# Patient Record
Sex: Female | Born: 1985 | Race: Black or African American | Hispanic: No | Marital: Single | Smoking: Never smoker
Health system: Southern US, Community
[De-identification: ages and names within clinical notes are randomized; demographics above are authoritative.]

## PROBLEM LIST (undated history)

## (undated) ENCOUNTER — Inpatient Hospital Stay (HOSPITAL_COMMUNITY): Payer: Self-pay

## (undated) DIAGNOSIS — B019 Varicella without complication: Secondary | ICD-10-CM

## (undated) DIAGNOSIS — Z9109 Other allergy status, other than to drugs and biological substances: Secondary | ICD-10-CM

## (undated) DIAGNOSIS — E119 Type 2 diabetes mellitus without complications: Secondary | ICD-10-CM

## (undated) DIAGNOSIS — J45909 Unspecified asthma, uncomplicated: Secondary | ICD-10-CM

## (undated) DIAGNOSIS — G43909 Migraine, unspecified, not intractable, without status migrainosus: Secondary | ICD-10-CM

## (undated) DIAGNOSIS — R12 Heartburn: Secondary | ICD-10-CM

## (undated) HISTORY — DX: Varicella without complication: B01.9

## (undated) HISTORY — DX: Migraine, unspecified, not intractable, without status migrainosus: G43.909

## (undated) HISTORY — DX: Other allergy status, other than to drugs and biological substances: Z91.09

## (undated) HISTORY — DX: Heartburn: R12

## (undated) HISTORY — DX: Unspecified asthma, uncomplicated: J45.909

## (undated) HISTORY — PX: OTHER SURGICAL HISTORY: SHX169

---

## 2005-05-29 ENCOUNTER — Emergency Department (HOSPITAL_COMMUNITY): Admission: EM | Admit: 2005-05-29 | Discharge: 2005-05-29 | Payer: Self-pay | Admitting: Emergency Medicine

## 2005-08-20 ENCOUNTER — Emergency Department (HOSPITAL_COMMUNITY): Admission: EM | Admit: 2005-08-20 | Discharge: 2005-08-21 | Payer: Self-pay | Admitting: Emergency Medicine

## 2005-09-08 ENCOUNTER — Emergency Department (HOSPITAL_COMMUNITY): Admission: EM | Admit: 2005-09-08 | Discharge: 2005-09-08 | Payer: Self-pay | Admitting: Emergency Medicine

## 2005-10-27 IMAGING — CT CT ABDOMEN W/ CM
1 of 3 series · 14 of 32 positions shown, 19 images · IV contrast (APPLIED)
Comparison: None.

CLINICAL DATA: Sudden onset right sided abdominal pain.  
 ABDOMEN CT WITH CONTRAST:
TECHNIQUE: Multidetector CT imaging of the abdomen was performed following the standard protocol during bolus administration of intravenous contrast.
 Contrast:  100 cc Omnipaque 300
TECHNIQUE: Multidetector CT imaging of the pelvis was performed following the standard protocol during bolus administration of intravenous contrast.

[Series 4: abd/pelv with 5.0 b31f st · axial · 0.55mm/px · z∈[-390,-30]mm · 14 of 81 slices shown, 19 images]
[im 5/81  soft-tissue]
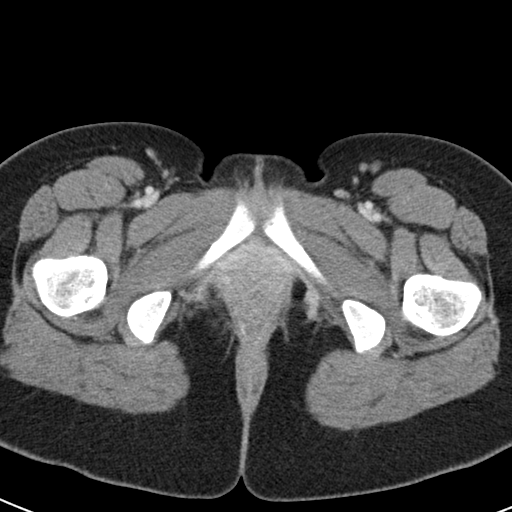
[im 5/81  bone]
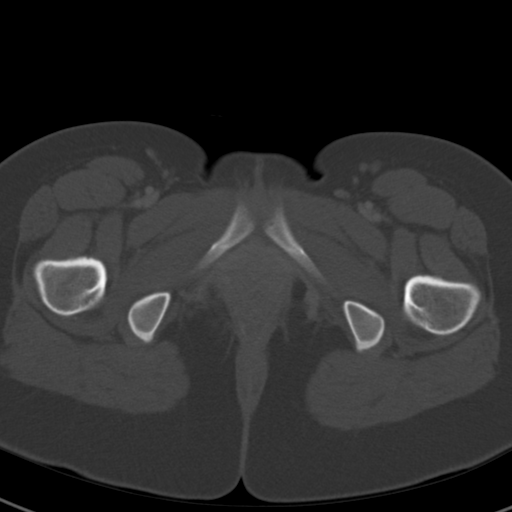
[im 13/81  soft-tissue]
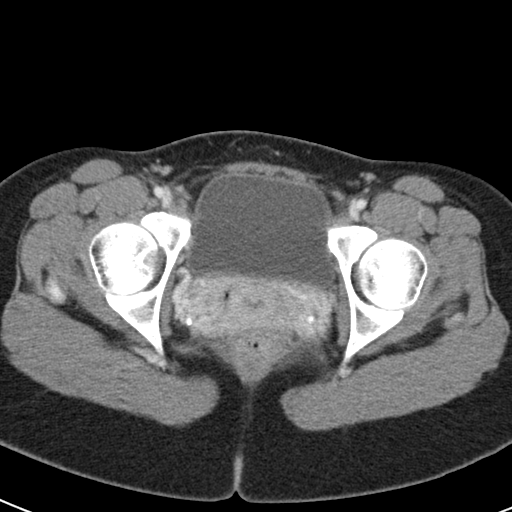
[im 17/81  soft-tissue]
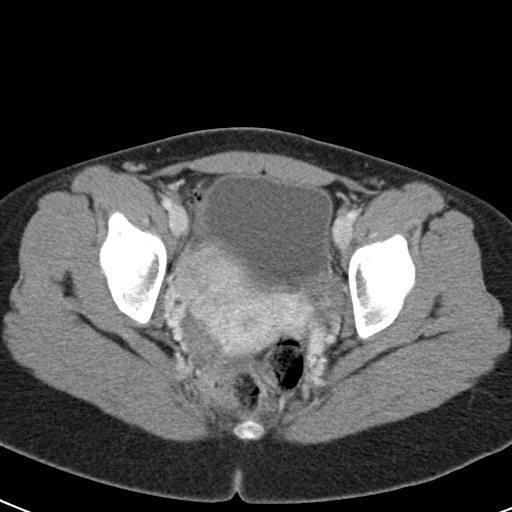
[im 25/81  soft-tissue]
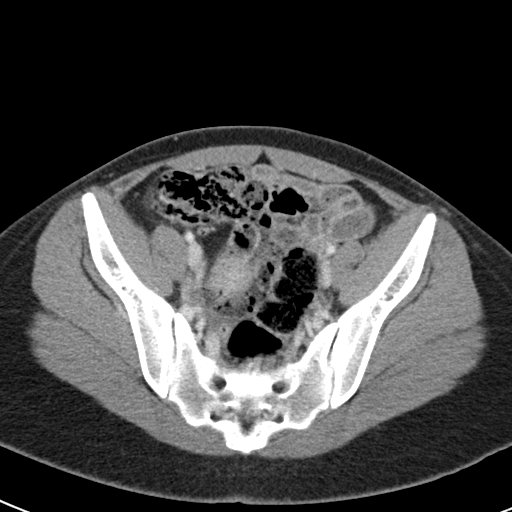
[im 29/81  soft-tissue]
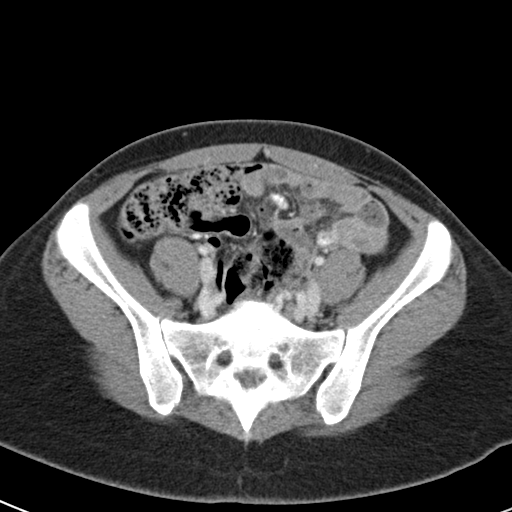
[im 37/81  soft-tissue]
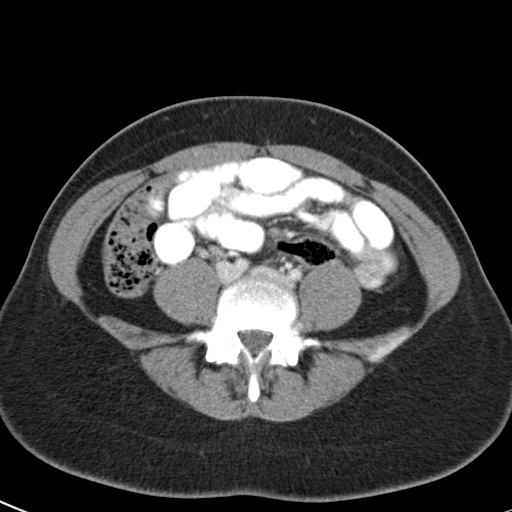
[im 41/81  soft-tissue]
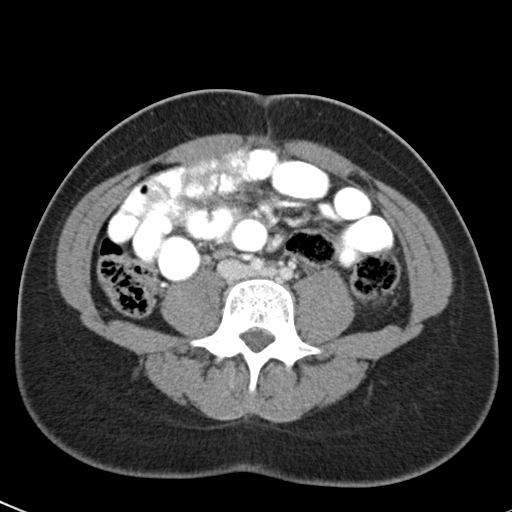
[im 45/81  soft-tissue]
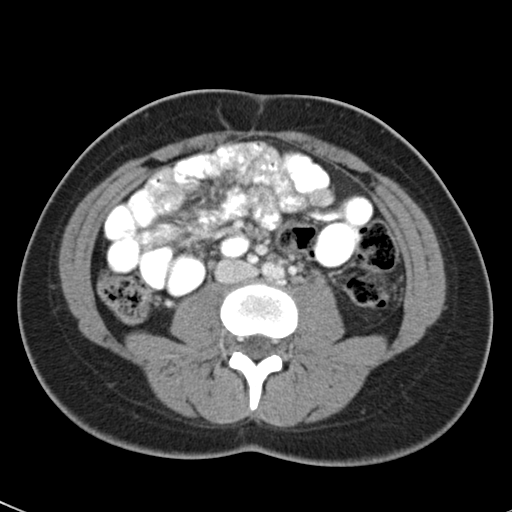
[im 53/81  soft-tissue]
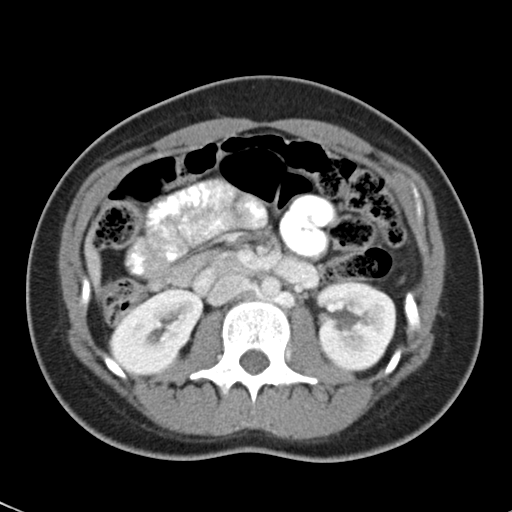
[im 53/81  bone]
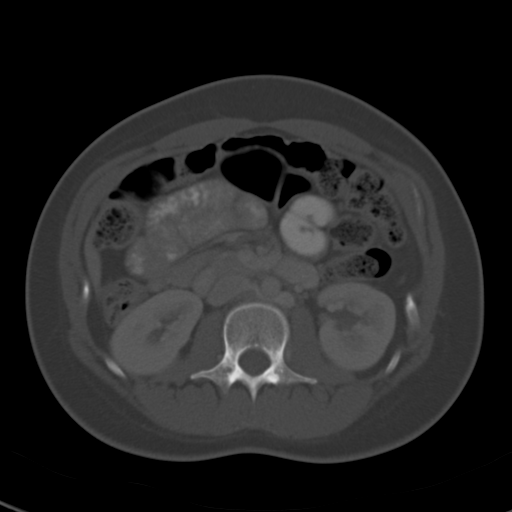
[im 57/81  soft-tissue]
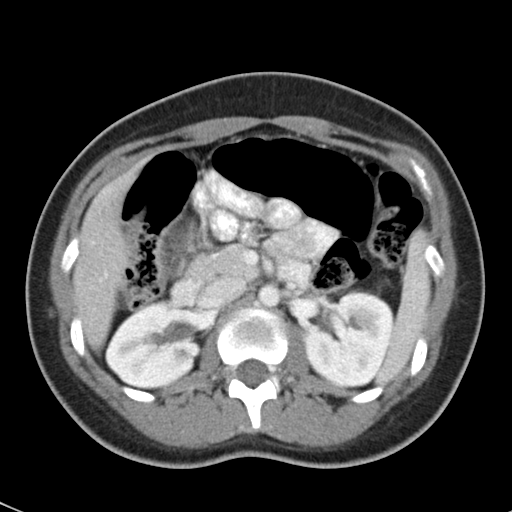
[im 65/81  soft-tissue]
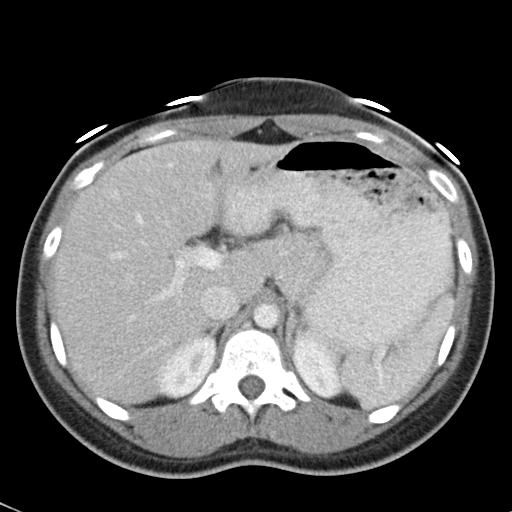
[im 65/81  lung]
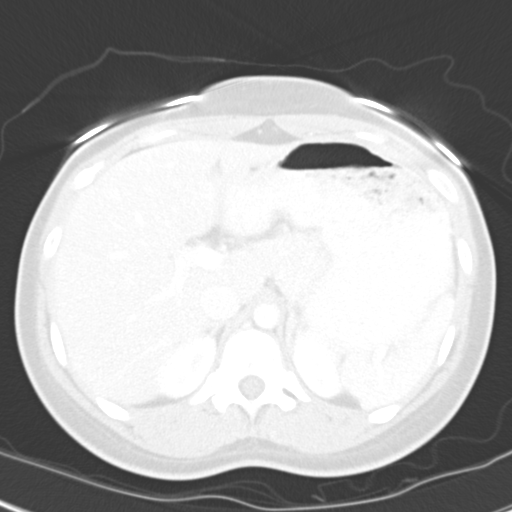
[im 69/81  soft-tissue]
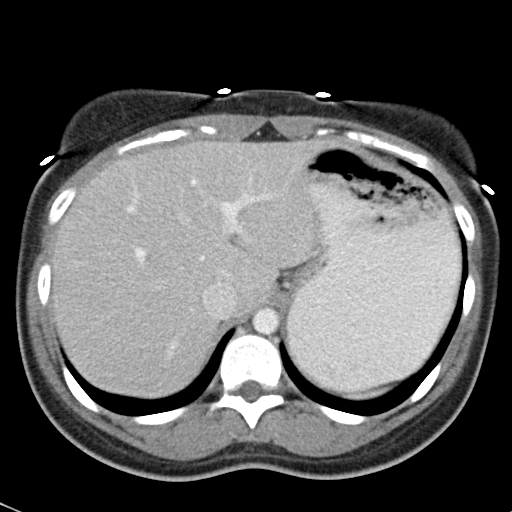
[im 69/81  lung]
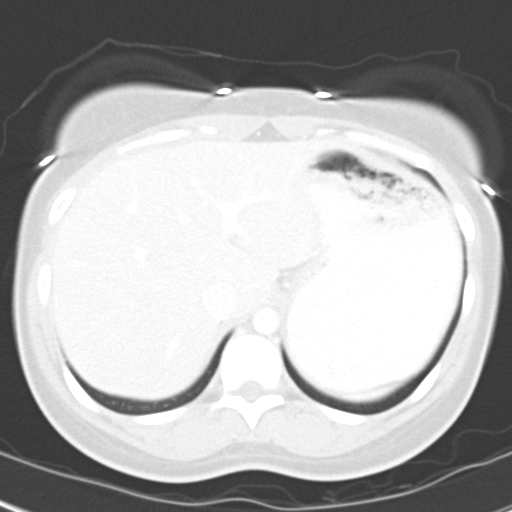
[im 73/81  lung]
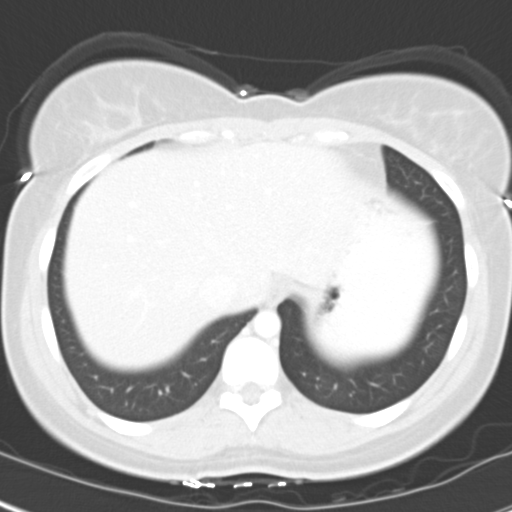
[im 77/81  soft-tissue]
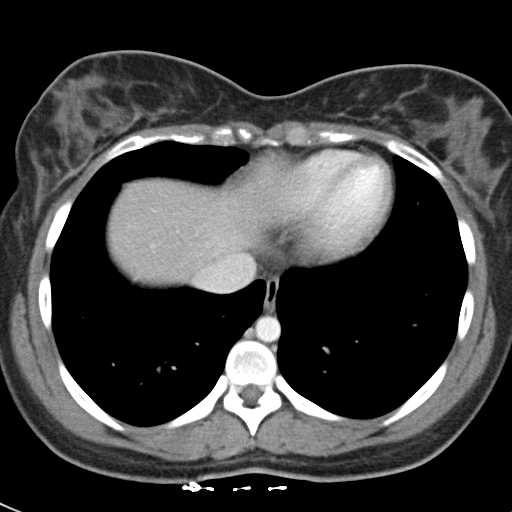
[im 77/81  lung]
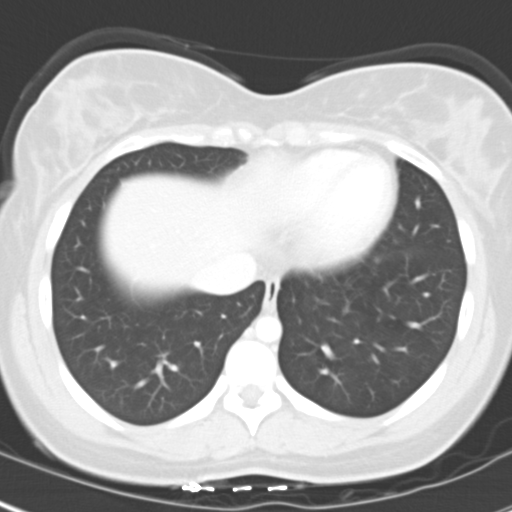

[14 of 32 positions shown; findings below may reference images not displayed]

FINDINGS: Lung bases are clear.  No pleural or pericardial effusion.  A small low attenuation lesion is seen in the anterior segment of the right lobe of the liver compatible with a small cyst.  The liver is otherwise unremarkable.  No intrahepatic duct dilatation.  Gallbladder appears normal.  Adrenal glands, pancreas, spleen and left kidney are all unremarkable.  A tiny low attenuation lesion is seen in the interpolar region of the right kidney compatible with a small cyst.  The stomach and small bowel are unremarkable.  No abdominal fluid collections or lymphadenopathy.
IMPRESSION: No acute finding in the abdomen with small hepatic and right renal cyst noted.  
 PELVIS CT WITH CONTRAST:
FINDINGS: The uterus and adnexa are unremarkable.  The appendix is seen in the right lower quadrant (image 53 series 5) and appears normal.  No pelvic fluid collections or lymphadenopathy.  The colon is unremarkable.  Uterus and adnexa appear normal.
IMPRESSION: Negative pelvic CT scan.

## 2006-06-01 ENCOUNTER — Emergency Department (HOSPITAL_COMMUNITY): Admission: EM | Admit: 2006-06-01 | Discharge: 2006-06-01 | Payer: Self-pay | Admitting: Family Medicine

## 2012-06-07 ENCOUNTER — Encounter: Payer: Self-pay | Admitting: Family

## 2012-06-07 ENCOUNTER — Ambulatory Visit (INDEPENDENT_AMBULATORY_CARE_PROVIDER_SITE_OTHER): Payer: Managed Care, Other (non HMO) | Admitting: Family

## 2012-06-07 VITALS — BP 112/76 | HR 79 | Temp 99.4°F | Resp 14 | Ht <= 58 in | Wt 144.0 lb

## 2012-06-07 DIAGNOSIS — Z13220 Encounter for screening for lipoid disorders: Secondary | ICD-10-CM

## 2012-06-07 DIAGNOSIS — J309 Allergic rhinitis, unspecified: Secondary | ICD-10-CM

## 2012-06-07 DIAGNOSIS — R5383 Other fatigue: Secondary | ICD-10-CM

## 2012-06-07 DIAGNOSIS — R5381 Other malaise: Secondary | ICD-10-CM

## 2012-06-07 DIAGNOSIS — R350 Frequency of micturition: Secondary | ICD-10-CM

## 2012-06-07 DIAGNOSIS — R635 Abnormal weight gain: Secondary | ICD-10-CM

## 2012-06-07 LAB — BASIC METABOLIC PANEL
BUN: 8 mg/dL (ref 6–23)
CO2: 25 mEq/L (ref 19–32)
Calcium: 9.2 mg/dL (ref 8.4–10.5)
Chloride: 103 mEq/L (ref 96–112)
Glucose, Bld: 69 mg/dL — ABNORMAL LOW (ref 70–99)
Potassium: 4 mEq/L (ref 3.5–5.1)

## 2012-06-07 LAB — POCT URINALYSIS DIPSTICK
Bilirubin, UA: NEGATIVE
Blood, UA: NEGATIVE
Ketones, UA: NEGATIVE
Leukocytes, UA: NEGATIVE
Nitrite, UA: NEGATIVE
Protein, UA: NEGATIVE
Spec Grav, UA: >=1.03
pH, UA: 5.5

## 2012-06-07 LAB — LIPID PANEL
Cholesterol: 136 mg/dL (ref 0–200)
HDL: 58.3 mg/dL (ref >39.00)
LDL Cholesterol: 62 mg/dL (ref 0–99)
Total CHOL/HDL Ratio: 2
VLDL: 16.2 mg/dL (ref 0.0–40.0)

## 2012-06-07 NOTE — Progress Notes (Signed)
  Subjective:    Patient ID: Victoria Yoder, female    DOB: September 18, 1986, 26 y.o.   MRN: [id number]  HPI  26 year old AAF is in today is in to be established. She has concerns of diabetes and urinary frequency. Also reports weight gain of about 20 pounds over the last 6 months. She exercises about twice a week. Her last CPX was 2011. She has not had a pap since 2011.  Review of Systems  Constitutional: Positive for appetite change, fatigue and unexpected weight change.  HENT: Negative.   Eyes: Negative.   Cardiovascular: Negative.   Gastrointestinal: Negative.   Genitourinary: Positive for frequency.  Musculoskeletal: Negative.   Skin: Negative.   Neurological: Negative.   Hematological: Negative.   Psychiatric/Behavioral: Negative.      Past Medical History  Diagnosis Date  . Environmental allergies   . Heartburn   . Asthma   . Chicken pox   . Migraine     History   Social History  . Marital Status: Single    Spouse Name: N/A    Number of Children: N/A  . Years of Education: N/A   Occupational History  . Not on file.   Social History Main Topics  . Smoking status: Never Smoker   . Smokeless tobacco: Not on file  . Alcohol Use: Not on file  . Drug Use: Not on file  . Sexually Active: Not on file   Other Topics Concern  . Not on file   Social History Narrative  . No narrative on file    Past Surgical History  Procedure Date  . Unremarkable.     Family History  Problem Relation Age of Onset  . Diabetes Maternal Grandmother   . Diabetes Paternal Grandmother   . Heart disease Paternal Grandmother   . Hypertension Paternal Grandmother   . Stroke Maternal Grandmother     No Known Allergies  Current Outpatient Prescriptions on File Prior to Visit  Medication Sig Dispense Refill  . loratadine (CLARITIN) 10 MG tablet Take 10 mg by mouth as needed.        BP 112/76  Pulse 79  Temp 99.4 F (37.4 C) (Oral)  Resp 14  Ht 4\' 3"  (1.295 m)  Wt 144 lb  (65.318 kg)  BMI 38.92 kg/m2  SpO2 93%  LMP 07/01/2013chart     Objective:   Physical Exam  Constitutional: She is oriented to person, place, and time. She appears well-developed and well-nourished.  HENT:  Right Ear: External ear normal.  Left Ear: External ear normal.  Nose: Nose normal.  Mouth/Throat: Oropharynx is clear and moist.  Neck: Normal range of motion. Neck supple.  Cardiovascular: Normal rate, regular rhythm and normal heart sounds.   Pulmonary/Chest: Effort normal and breath sounds normal.  Abdominal: Soft. Bowel sounds are normal.  Musculoskeletal: Normal range of motion.  Neurological: She is alert and oriented to person, place, and time.  Skin: Skin is warm and dry.  Psychiatric: She has a normal mood and affect.          Assessment & Plan:  Assessment: Urinary Frequency, Weight Gain,   Plan: Labs sent. Schedule CPX. Will notify patient pending results. Encouraged healthy diet and exercise.

## 2012-06-11 LAB — VITAMIN D 1,25 DIHYDROXY
Vitamin D 1, 25 (OH)2 Total: 78 pg/mL — ABNORMAL HIGH (ref 18–72)
Vitamin D2 1, 25 (OH)2: <8 pg/mL
Vitamin D3 1, 25 (OH)2: 78 pg/mL

## 2012-06-21 ENCOUNTER — Ambulatory Visit (INDEPENDENT_AMBULATORY_CARE_PROVIDER_SITE_OTHER): Payer: Managed Care, Other (non HMO) | Admitting: Family

## 2012-06-21 ENCOUNTER — Other Ambulatory Visit (HOSPITAL_COMMUNITY)
Admission: RE | Admit: 2012-06-21 | Discharge: 2012-06-21 | Disposition: A | Discharge status: Home / Self Care | Payer: Managed Care, Other (non HMO) | Source: Ambulatory Visit | Attending: Family | Admitting: Family

## 2012-06-21 ENCOUNTER — Encounter: Payer: Self-pay | Admitting: Family

## 2012-06-21 VITALS — BP 122/82 | HR 68 | Temp 98.6°F | Ht 61.0 in | Wt 143.0 lb

## 2012-06-21 DIAGNOSIS — Z124 Encounter for screening for malignant neoplasm of cervix: Secondary | ICD-10-CM

## 2012-06-21 DIAGNOSIS — Z0000 Encounter for general adult medical examination without abnormal findings: Secondary | ICD-10-CM

## 2012-06-21 DIAGNOSIS — Z111 Encounter for screening for respiratory tuberculosis: Secondary | ICD-10-CM

## 2012-06-21 DIAGNOSIS — Z01419 Encounter for gynecological examination (general) (routine) without abnormal findings: Secondary | ICD-10-CM | POA: Insufficient documentation

## 2012-06-21 NOTE — Patient Instructions (Addendum)

## 2012-06-21 NOTE — Progress Notes (Signed)
  Subjective:    Patient ID: Victoria Yoder, female    DOB: Mar 28, 1986, 28 y.o.   MRN: [id number]  HPI 26 year old [place] American female, nonsmoker is in for complete physical exam. Denies any concerns. This is a routine physical examination for this healthy  Female. Reviewed all health maintenance protocols including reviewed appropriate screening labs. Her immunization history was reviewed as well as her current medications and allergies. Health  maintenance was discussed all orders and referrals were made as appropriate.   Review of Systems  All other systems reviewed and are negative.   Past Medical History  Diagnosis Date  . Environmental allergies   . Heartburn   . Asthma   . Chicken pox   . Migraine     History   Social History  . Marital Status: Single    Spouse Name: N/A    Number of Children: N/A  . Years of Education: N/A   Occupational History  . Not on file.   Social History Main Topics  . Smoking status: Never Smoker   . Smokeless tobacco: Not on file  . Alcohol Use: Not on file  . Drug Use: Not on file  . Sexually Active: Not on file   Other Topics Concern  . Not on file   Social History Narrative  . No narrative on file    Past Surgical History  Procedure Date  . Unremarkable.     Family History  Problem Relation Age of Onset  . Diabetes Maternal Grandmother   . Diabetes Paternal Grandmother   . Heart disease Paternal Grandmother   . Hypertension Paternal Grandmother   . Stroke Maternal Grandmother     No Known Allergies  Current Outpatient Prescriptions on File Prior to Visit  Medication Sig Dispense Refill  . cetirizine (ZYRTEC) 10 MG tablet Take 10 mg by mouth as needed.      . Multiple Vitamin (MULTIVITAMIN) tablet Take 1 tablet by mouth daily.        BP 122/82  Pulse 68  Temp 98.6 F (37 C) (Oral)  Ht 5\' 1"  (1.549 m)  Wt 143 lb (64.864 kg)  BMI 27.02 kg/m2  SpO2 97%  LMP 08/04/2013chart    Objective:   Physical Exam    Constitutional: She is oriented to person, place, and time. She appears well-developed and well-nourished.  HENT:  Head: Normocephalic and atraumatic.  Right Ear: External ear normal.  Left Ear: External ear normal.  Nose: Nose normal.  Mouth/Throat: Oropharynx is clear and moist.  Eyes: Conjunctivae are normal. Pupils are equal, round, and reactive to light.  Neck: Normal range of motion. Neck supple.  Cardiovascular: Normal rate, regular rhythm, normal heart sounds and intact distal pulses.   Pulmonary/Chest: Effort normal and breath sounds normal.  Abdominal: Soft. Bowel sounds are normal.  Genitourinary: Vagina normal and uterus normal.  Musculoskeletal: Normal range of motion.  Neurological: She is alert and oriented to person, place, and time. She has normal reflexes.  Skin: Skin is warm and dry.  Psychiatric: She has a normal mood and affect.          Assessment & Plan:  Assessment: Complete physical exam, need for TB skin test  Plan: Encouraged healthy diet, exercise, self breast exams. Follow up patient in one year and sooner when necessary. Pap smear sent. TB skin test to be read on Monday.

## 2012-06-24 ENCOUNTER — Encounter: Payer: Self-pay | Admitting: Family Medicine

## 2012-06-24 LAB — TB SKIN TEST: TB Skin Test: NEGATIVE

## 2012-12-06 ENCOUNTER — Encounter: Payer: Self-pay | Admitting: Family

## 2012-12-06 ENCOUNTER — Ambulatory Visit (INDEPENDENT_AMBULATORY_CARE_PROVIDER_SITE_OTHER): Payer: Managed Care, Other (non HMO) | Admitting: Family

## 2012-12-06 VITALS — BP 120/78 | HR 102 | Temp 98.5°F | Wt 148.0 lb

## 2012-12-06 DIAGNOSIS — Z3009 Encounter for other general counseling and advice on contraception: Secondary | ICD-10-CM

## 2012-12-06 DIAGNOSIS — J309 Allergic rhinitis, unspecified: Secondary | ICD-10-CM

## 2012-12-06 NOTE — Patient Instructions (Addendum)
Contraception Choices Birth control (contraception) can stop pregnancy from happening. Different types of birth control work in different ways. Some can:  Make the mucus in the cervix thick. This makes it hard for sperm to get into the uterus.  Thin the lining of the uterus. This makes it hard for an egg to attach to the wall of the uterus.  Stop the ovaries from releasing an egg.  Block the sperm from reaching the egg. Certain types of surgery can stop pregnancy from happening. For women, the sugery closes the fallopian tubes (tubal ligation). For men, the surgery stops sperm from releasing during sex (vasectomy). HORMONAL BIRTH CONTROL Hormonal birth control stops pregnancy by putting hormones into your body. Types of birth control include:  A small tube put under the skin of the upper arm (implant). The tube can stay in place for 3 years.  Shots given every 3 months.  Pills taken every day or once after sex (intercourse).  Patches that are changed once a week.  A ring put into the vagina (vaginal ring). The ring is left in place for 3 weeks and removed for 1 week. Then, a new ring is put in the vagina. BARRIER BIRTH CONTROL  Barrier birth control blocks sperm from reaching the egg. Types of birth control include:   A thin covering worn on the penis (female condom) during sex.  A soft, loose covering put into the vagina (female condom) before sex.  A rubber bowl that sits over the cervix (diaphragm). The bowl must be made for you. The bowl is put into the vagina before sex. The bowl is left in place for 6 to 8 hours after sex.  A small, soft cup that fits over the cervix (cervical cap). The cup must be made for you. The cup can be left in place for 48 hours after sex.  A sponge that is put into the vagina before sex.  A chemical that kills or blocks sperm from getting into the cervix and uterus (spermicide). The chemical may be a cream, jelly, foam, or pill. INTRAUTERINE (IUD)  BIRTH CONTROL  IUD birth control is a small, T-shaped piece of plastic. The plastic is put inside the uterus. There are 2 types of IUD:  Copper IUD. The IUD is covered in copper wire. The copper makes a fluid that kills sperm. It can stay in place for 10 years.  Hormone IUD. The hormone stops pregnancy from happening. It can stay in place for 5 years. NATURAL FAMILY PLANNING BIRTH CONTROL  Natural family planning means not having sex or using barrier birth control when the woman is fertile. A woman can:  Use a calendar to keep track of when she is fertile.  Use a thermometer to measure her body temperature. Protect yourself against sexual diseases no matter what type of birth control you use. Talk to your doctor about which type of birth control is best for you. Document Released: 08/20/2009 Document Revised: 01/15/2012 Document Reviewed: 03/01/2011 [employer] Patient Information 2013 [place], [place]. Oral Contraception Information Oral contraceptives (OCs) are medicines taken to prevent pregnancy. OCs work by preventing the ovaries from releasing eggs. The hormones in OCs also cause the cervical mucus to thicken, preventing the sperm from entering the uterus. The hormones also cause the uterine lining to become thin, not allowing a fertilized egg to attach to the inside of the uterus. OCs are highly effective when taken exactly as prescribed. However, OCs do not prevent sexually transmitted diseases (STDs). Safe sex  practices, such as using condoms along with the pill, can help prevent STDs.  Before taking the pill, you may have a physical exam and Pap test. Your caregiver may order blood tests that may be necessary. Your caregiver will make sure you are a good candidate for oral contraception. Discuss with your caregiver the possible side effects of the OC you may be prescribed. When starting an OC, it can take 2 to 3 months for the body to adjust to the changes in hormone levels in your body.    TYPES OF ORAL CONTRACEPTION  The combination pill. This pill contains estrogen and progestin (synthetic progesterone) hormones. The combination pill comes in either 21-day or 28-day packs. With 21-day packs, you do not take pills for 7 days after the last pill. With 28-day packs, the pill is taken every day. The last 7 pills are without hormones. Certain types of pills have more than 21 hormone-containing pills.  The minipill. This pill contains the progesterone hormone only. It is taken every day continuously. The minipill comes in packs of 91 pills. The first 84 pills contain the hormones, and the last 7 pills do not. The last 7 days are when you will have your menstrual period. You may experience irregular spotting. ADVANTAGES  Decreases premenstrual symptoms.  Treats menstrual period cramps.  Regulates the menstrual cycle.  Decreases a heavy menstrual flow.  Treats acne.  Treats abnormal uterine bleeding.  Treats chronic pelvic pain.  Treats polycystic ovarian syndrome.  Treats endometriosis.  Can be used as emergency contraception. DISADVANTAGES OCs can be less effective if:  You forget to take the pill at the same time every day.  You have a stomach or intestinal disease that lessens the absorption of the pill.  You take OCs with other medicines that make OCs less effective.  You take expired OCs.  You forget to restart the pill on day 7, when using the packs of 21 pills. Document Released: 01/13/2003 Document Revised: 01/15/2012 Document Reviewed: 03/01/2011 [facility] Patient Information 2013 [place], [place].

## 2012-12-06 NOTE — Progress Notes (Signed)
  Subjective:    Patient ID: Victoria Yoder, female    DOB: February 03, 1986, 27 y.o.   MRN: [id number]  HPI 27 year old African American female, nonsmoker is in to discuss birth control management. She is married in a monogamous relationship and is not ready for children at this point. She's unsure what method of birth control. Normal Pap smear in August of 2013   Review of Systems  Constitutional: Negative.   Respiratory: Negative.   Cardiovascular: Negative.   Psychiatric/Behavioral: Negative.    Past Medical History  Diagnosis Date  . Environmental allergies   . Heartburn   . Asthma   . Chicken pox   . Migraine     History   Social History  . Marital Status: Single    Spouse Name: N/A    Number of Children: N/A  . Years of Education: N/A   Occupational History  . Not on file.   Social History Main Topics  . Smoking status: Never Smoker   . Smokeless tobacco: Not on file  . Alcohol Use: Not on file  . Drug Use: Not on file  . Sexually Active: Not on file   Other Topics Concern  . Not on file   Social History Narrative  . No narrative on file    Past Surgical History  Procedure Date  . Unremarkable.     Family History  Problem Relation Age of Onset  . Diabetes Maternal Grandmother   . Diabetes Paternal Grandmother   . Heart disease Paternal Grandmother   . Hypertension Paternal Grandmother   . Stroke Maternal Grandmother     No Known Allergies  Current Outpatient Prescriptions on File Prior to Visit  Medication Sig Dispense Refill  . cetirizine (ZYRTEC) 10 MG tablet Take 10 mg by mouth as needed.      . Multiple Vitamin (MULTIVITAMIN) tablet Take 1 tablet by mouth daily.        BP 120/78  Pulse 102  Temp 98.5 F (36.9 C) (Oral)  Wt 148 lb (67.132 kg)  SpO2 98%chart    Objective:   Physical Exam  Constitutional: She is oriented to person, place, and time. She appears well-developed and well-nourished.  Cardiovascular: Normal rate, regular  rhythm and normal heart sounds.   Pulmonary/Chest: Effort normal and breath sounds normal.  Neurological: She is alert and oriented to person, place, and time.  Skin: Skin is warm and dry.  Psychiatric: She has a normal mood and affect.          Assessment & Plan:  Assessment: Counseling on contraceptive  Plan: Discussed various options of birth control to include oral contraceptive pills, Ortho Evra, Depo-Provera, Mirena, and NuvaRing. Patient will call with her decision later on today.

## 2012-12-08 ENCOUNTER — Encounter: Payer: Self-pay | Admitting: Family

## 2012-12-09 ENCOUNTER — Other Ambulatory Visit: Payer: Self-pay | Admitting: Family

## 2012-12-09 MED ORDER — LEVONORGESTREL-ETHINYL ESTRAD 0.15-30 MG-MCG PO TABS: 1.0000 | ORAL_TABLET | Freq: Every day | ORAL | Status: DC

## 2012-12-21 ENCOUNTER — Other Ambulatory Visit: Payer: Self-pay

## 2013-04-30 ENCOUNTER — Encounter: Payer: Self-pay | Admitting: Family

## 2013-04-30 ENCOUNTER — Ambulatory Visit (INDEPENDENT_AMBULATORY_CARE_PROVIDER_SITE_OTHER): Payer: Managed Care, Other (non HMO) | Admitting: Family

## 2013-04-30 VITALS — BP 104/80 | HR 91 | Temp 98.6°F | Wt 148.0 lb

## 2013-04-30 DIAGNOSIS — R112 Nausea with vomiting, unspecified: Secondary | ICD-10-CM

## 2013-04-30 DIAGNOSIS — R109 Unspecified abdominal pain: Secondary | ICD-10-CM

## 2013-04-30 DIAGNOSIS — R197 Diarrhea, unspecified: Secondary | ICD-10-CM

## 2013-04-30 LAB — POCT URINALYSIS DIPSTICK
Bilirubin, UA: NEGATIVE
Blood, UA: NEGATIVE
Glucose, UA: NEGATIVE
Ketones, UA: NEGATIVE
Nitrite, UA: NEGATIVE
Protein, UA: NEGATIVE
Spec Grav, UA: 1.01
Urobilinogen, UA: 0.2
pH, UA: 6.5

## 2013-04-30 LAB — POCT URINE PREGNANCY: Preg Test, Ur: NEGATIVE

## 2013-04-30 MED ORDER — ONDANSETRON HCL 8 MG PO TABS: 8.0000 mg | ORAL_TABLET | Freq: Three times a day (TID) | ORAL | Status: DC | PRN

## 2013-04-30 MED ORDER — DICYCLOMINE HCL 20 MG PO TABS: 20.0000 mg | ORAL_TABLET | Freq: Four times a day (QID) | ORAL | Status: DC

## 2013-04-30 NOTE — Patient Instructions (Addendum)
Viral Gastroenteritis Viral gastroenteritis is also known as stomach flu. This condition affects the stomach and intestinal tract. It can cause sudden diarrhea and vomiting. The illness typically lasts 3 to 8 days. Most people develop an immune response that eventually gets rid of the virus. While this natural response develops, the virus can make you quite ill. CAUSES  Many different viruses can cause gastroenteritis, such as rotavirus or noroviruses. You can catch one of these viruses by consuming contaminated food or water. You may also catch a virus by sharing utensils or other personal items with an infected person or by touching a contaminated surface. SYMPTOMS  The most common symptoms are diarrhea and vomiting. These problems can cause a severe loss of body fluids (dehydration) and a body salt (electrolyte) imbalance. Other symptoms may include:  Fever.  Headache.  Fatigue.  Abdominal pain. DIAGNOSIS  Your caregiver can usually diagnose viral gastroenteritis based on your symptoms and a physical exam. A stool sample may also be taken to test for the presence of viruses or other infections. TREATMENT  This illness typically goes away on its own. Treatments are aimed at rehydration. The most serious cases of viral gastroenteritis involve vomiting so severely that you are not able to keep fluids down. In these cases, fluids must be given through an intravenous line (IV). HOME CARE INSTRUCTIONS   Drink enough fluids to keep your urine clear or pale yellow. Drink small amounts of fluids frequently and increase the amounts as tolerated.  Ask your caregiver for specific rehydration instructions.  Avoid:  Foods high in sugar.  Alcohol.  Carbonated drinks.  Tobacco.  Juice.  Caffeine drinks.  Extremely hot or cold fluids.  Fatty, greasy foods.  Too much intake of anything at one time.  Dairy products until 24 to 48 hours after diarrhea stops.  You may consume probiotics.  Probiotics are active cultures of beneficial bacteria. They may lessen the amount and number of diarrheal stools in adults. Probiotics can be found in yogurt with active cultures and in supplements.  Wash your hands well to avoid spreading the virus.  Only take over-the-counter or prescription medicines for pain, discomfort, or fever as directed by your caregiver. Do not give aspirin to children. Antidiarrheal medicines are not recommended.  Ask your caregiver if you should continue to take your regular prescribed and over-the-counter medicines.  Keep all follow-up appointments as directed by your caregiver. SEEK IMMEDIATE MEDICAL CARE IF:   You are unable to keep fluids down.  You do not urinate at least once every 6 to 8 hours.  You develop shortness of breath.  You notice blood in your stool or vomit. This may look like coffee grounds.  You have abdominal pain that increases or is concentrated in one small area (localized).  You have persistent vomiting or diarrhea.  You have a fever.  The patient is a child younger than 3 months, and he or she has a fever.  The patient is a child older than 3 months, and he or she has a fever and persistent symptoms.  The patient is a child older than 3 months, and he or she has a fever and symptoms suddenly get worse.  The patient is a baby, and he or she has no tears when crying. MAKE SURE YOU:   Understand these instructions.  Will watch your condition.  Will get help right away if you are not doing well or get worse. Document Released: 10/23/2005 Document Revised: 01/15/2012 Document Reviewed: 08/09/2011   [employer] Patient Information 2014 [employer].  

## 2013-04-30 NOTE — Progress Notes (Signed)
Subjective:    Patient ID: Victoria Yoder, female    DOB: [birth year], 27 y.o.   MRN: [id number]  HPI 27 year old African American female, nonsmoker is in today with complaints of abdominal pain, nausea, vomiting, diarrhea that began on Saturday, 3 days ago. She reports abdominal pain as 7-8/10 described as crampy. It consistently gets a sharp pain throughout abdomen at times. Has been taken Pepto-Bismol that she just began today that has not helped much. Denies any new or exotic foods on Saturday. Does report eating a [employer]. Last missed her period 04/01/2013. Reports developing a headache today. Denies any blood in her stools are dark black stools.   Review of Systems  Constitutional: Positive for appetite change. Negative for fever and chills.  HENT: Negative.   Respiratory: Negative.   Cardiovascular: Negative.   Gastrointestinal: Positive for nausea, vomiting, abdominal pain and diarrhea. Negative for abdominal distention and rectal pain.  Endocrine: Negative.   Musculoskeletal: Negative.   Skin: Negative.   Allergic/Immunologic: Negative.   Neurological: Negative.   Hematological: Negative.   Psychiatric/Behavioral: Negative.    Past Medical History  Diagnosis Date  . Environmental allergies   . Heartburn   . Asthma   . Chicken pox   . Migraine     History   Social History  . Marital Status: Single    Spouse Name: N/A    Number of Children: N/A  . Years of Education: N/A   Occupational History  . Not on file.   Social History Main Topics  . Smoking status: Never Smoker   . Smokeless tobacco: Not on file  . Alcohol Use: Not on file  . Drug Use: Not on file  . Sexually Active: Not on file   Other Topics Concern  . Not on file   Social History Narrative  . No narrative on file    Past Surgical History  Procedure Laterality Date  . Unremarkable.      Family History  Problem Relation Age of Onset  . Diabetes Maternal Grandmother   . Diabetes  Paternal Grandmother   . Heart disease Paternal Grandmother   . Hypertension Paternal Grandmother   . Stroke Maternal Grandmother     No Known Allergies  Current Outpatient Prescriptions on File Prior to Visit  Medication Sig Dispense Refill  . cetirizine (ZYRTEC) 10 MG tablet Take 10 mg by mouth as needed.      Marland Kitchen levonorgestrel-ethinyl estradiol (NORDETTE) 0.15-30 MG-MCG tablet Take 1 tablet by mouth daily.  1 Package  11  . Multiple Vitamin (MULTIVITAMIN) tablet Take 1 tablet by mouth daily.       No current facility-administered medications on file prior to visit.    BP 104/80  Pulse 91  Temp(Src) 98.6 F (37 C) (Oral)  Wt 148 lb (67.132 kg)  BMI 27.98 kg/m2  SpO2 96%  LMP 05/27/2014chart    Objective:   Physical Exam  Constitutional: She is oriented to person, place, and time. She appears well-developed and well-nourished.  HENT:  Right Ear: External ear normal.  Left Ear: External ear normal.  Nose: Nose normal.  Mouth/Throat: Oropharynx is clear and moist.  Neck: Normal range of motion. Neck supple.  Cardiovascular: Normal rate, regular rhythm and normal heart sounds.   Pulmonary/Chest: Effort normal and breath sounds normal.  Abdominal: Soft. She exhibits no mass. There is tenderness.  Generalized abdominal pain. Tender to palpation. No rebound tenderness or guarding.  Musculoskeletal: Normal range of motion.  Neurological: She is alert and oriented  to person, place, and time.  Skin: Skin is warm and dry.  Psychiatric: She has a normal mood and affect.      UA: Normal Urine pregnancy test: Negative    Assessment & Plan:  Assessment:  1. Generalized abdominal pain 2. Nausea and vomiting 3. Diarrhea  Plan: CBC and CMP sent. Will notify patient pending results. Encouraged her to drink plenty of fluids. Bentyl 20 mg as needed for abdominal cramping and pain. Zofran 8 mg as needed for nausea. Rest. Call the office if symptoms worsen or persist. Recheck a  schedule, and as needed.

## 2013-05-01 LAB — CBC WITH DIFFERENTIAL/PLATELET
Basophils Relative: 0.4 % (ref 0.0–3.0)
Eosinophils Absolute: 0 10*3/uL (ref 0.0–0.7)
Eosinophils Relative: 0.2 % (ref 0.0–5.0)
HCT: 40.9 % (ref 36.0–46.0)
Hemoglobin: 13.7 g/dL (ref 12.0–15.0)
Lymphs Abs: 2.5 10*3/uL (ref 0.7–4.0)
MCHC: 33.4 g/dL (ref 30.0–36.0)
MCV: 90 fl (ref 78.0–100.0)
Monocytes Absolute: 0.5 10*3/uL (ref 0.1–1.0)
Monocytes Relative: 4.4 % (ref 3.0–12.0)
Neutro Abs: 9.1 10*3/uL — ABNORMAL HIGH (ref 1.4–7.7)
Neutrophils Relative %: 74.4 % (ref 43.0–77.0)
Platelets: 363 10*3/uL (ref 150.0–400.0)
RBC: 4.55 Mil/uL (ref 3.87–5.11)
WBC: 12.2 10*3/uL — ABNORMAL HIGH (ref 4.5–10.5)

## 2013-05-01 LAB — COMPREHENSIVE METABOLIC PANEL
ALT: 23 U/L (ref 0–35)
Albumin: 4.1 g/dL (ref 3.5–5.2)
Alkaline Phosphatase: 38 U/L — ABNORMAL LOW (ref 39–117)
BUN: 7 mg/dL (ref 6–23)
CO2: 22 mEq/L (ref 19–32)
Calcium: 9.3 mg/dL (ref 8.4–10.5)
Creatinine, Ser: 0.7 mg/dL (ref 0.4–1.2)
GFR: 119.62 mL/min (ref >60.00)
Glucose, Bld: 78 mg/dL (ref 70–99)
Potassium: 4.5 mEq/L (ref 3.5–5.1)
Sodium: 136 mEq/L (ref 135–145)
Total Bilirubin: 0.5 mg/dL (ref 0.3–1.2)
Total Protein: 8.4 g/dL — ABNORMAL HIGH (ref 6.0–8.3)

## 2013-05-01 LAB — URINE CULTURE
Colony Count: NO GROWTH
Organism ID, Bacteria: NO GROWTH

## 2013-05-02 ENCOUNTER — Telehealth: Payer: Self-pay

## 2013-05-02 NOTE — Telephone Encounter (Signed)
Pt left message stating that she feels better today than yesterday but still has no real appetite. She also asked about lab results. Please advise

## 2013-05-02 NOTE — Telephone Encounter (Signed)
Pt aware.

## 2013-05-02 NOTE — Telephone Encounter (Signed)
Labs are as expected. Viral gastroenteritis.

## 2013-07-01 ENCOUNTER — Encounter: Payer: Self-pay | Admitting: Family

## 2013-07-01 ENCOUNTER — Ambulatory Visit (INDEPENDENT_AMBULATORY_CARE_PROVIDER_SITE_OTHER): Payer: Managed Care, Other (non HMO) | Admitting: Family

## 2013-07-01 VITALS — BP 110/80 | Temp 98.4°F | Ht 61.0 in | Wt 145.0 lb

## 2013-07-01 DIAGNOSIS — Z0000 Encounter for general adult medical examination without abnormal findings: Secondary | ICD-10-CM

## 2013-07-01 NOTE — Progress Notes (Signed)
Subjective:    Patient ID: Victoria Yoder, female    DOB: 1986/01/07, 27 y.o.   MRN: [id number]  HPI 28 year old [place] American female, nonsmoker is in for a complete physical exam for work. 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 Denies any concerns today. She will return for gynecological care later.   Review of Systems  Constitutional: Negative.   HENT: Negative.   Eyes: Negative.   Respiratory: Negative.   Cardiovascular: Negative.   Gastrointestinal: Negative.   Endocrine: Negative.   Genitourinary: Negative.   Musculoskeletal: Negative.   Skin: Negative.   Allergic/Immunologic: Negative.   Neurological: Negative.   Hematological: Negative.   Psychiatric/Behavioral: Negative.    Past Medical History  Diagnosis Date  . Environmental allergies   . Heartburn   . Asthma   . Chicken pox   . Migraine     History   Social History  . Marital Status: Single    Spouse Name: N/A    Number of Children: N/A  . Years of Education: N/A   Occupational History  . Not on file.   Social History Main Topics  . Smoking status: Never Smoker   . Smokeless tobacco: Not on file  . Alcohol Use: Not on file  . Drug Use: Not on file  . Sexual Activity: Not on file   Other Topics Concern  . Not on file   Social History Narrative  . No narrative on file    Past Surgical History  Procedure Laterality Date  . Unremarkable.      Family History  Problem Relation Age of Onset  . Diabetes Maternal Grandmother   . Diabetes Paternal Grandmother   . Heart disease Paternal Grandmother   . Hypertension Paternal Grandmother   . Stroke Maternal Grandmother     No Known Allergies  Current Outpatient Prescriptions on File Prior to Visit  Medication Sig Dispense Refill  . cetirizine (ZYRTEC) 10 MG tablet Take 10 mg by mouth as needed.      . dicyclomine (BENTYL) 20 MG tablet Take 1 tablet (20 mg total) by mouth every 6 (six) hours.  30 tablet  0  .  levonorgestrel-ethinyl estradiol (NORDETTE) 0.15-30 MG-MCG tablet Take 1 tablet by mouth daily.  1 Package  11  . Multiple Vitamin (MULTIVITAMIN) tablet Take 1 tablet by mouth daily.      . ondansetron (ZOFRAN) 8 MG tablet Take 1 tablet (8 mg total) by mouth every 8 (eight) hours as needed for nausea.  20 tablet  0   No current facility-administered medications on file prior to visit.    BP 110/80  Temp(Src) 98.4 F (36.9 C) (Oral)  Ht 5\' 1"  (1.549 m)  Wt 145 lb (65.772 kg)  BMI 27.41 kg/m2chart    Objective:   Physical Exam  Constitutional: She is oriented to person, place, and time. She appears well-developed and well-nourished.  HENT:  Head: Normocephalic.  Right Ear: External ear normal.  Left Ear: External ear normal.  Nose: Nose normal.  Mouth/Throat: Oropharynx is clear and moist.  Eyes: Conjunctivae and EOM are normal. Pupils are equal, round, and reactive to light.  Neck: Normal range of motion. No thyromegaly present.  Cardiovascular: Normal rate, regular rhythm and normal heart sounds.   Pulmonary/Chest: Effort normal and breath sounds normal.  Abdominal: Soft. Bowel sounds are normal.  Musculoskeletal: Normal range of motion.  Neurological: She is alert and oriented to person, place, and time. She has normal reflexes. She displays normal reflexes.  No cranial nerve deficit. Coordination normal.  Skin: Skin is warm and dry.  Psychiatric: She has a normal mood and affect.          Assessment & Plan:  Assessment: 1. Complete physical exam   Plan: MMR and hepatitis B antibody sent.  will completely appropriate paperwork thereafter.

## 2013-07-01 NOTE — Patient Instructions (Signed)

## 2013-07-02 LAB — MEASLES/MUMPS/RUBELLA IMMUNITY
Mumps IgG: 100 AU/mL — ABNORMAL HIGH (ref <9.00)
Rubella: 7.76 Index — ABNORMAL HIGH (ref <0.90)
Rubeola IgG: 145 AU/mL — ABNORMAL HIGH (ref <25.00)

## 2013-07-02 LAB — HEPATITIS B SURFACE ANTIBODY,QUALITATIVE: Hep B S Ab: POSITIVE — AB

## 2013-07-08 ENCOUNTER — Ambulatory Visit (INDEPENDENT_AMBULATORY_CARE_PROVIDER_SITE_OTHER): Payer: Managed Care, Other (non HMO)

## 2013-07-08 DIAGNOSIS — Z23 Encounter for immunization: Secondary | ICD-10-CM

## 2013-09-11 ENCOUNTER — Other Ambulatory Visit: Payer: Self-pay

## 2014-03-31 ENCOUNTER — Encounter: Payer: Self-pay | Admitting: Family

## 2014-03-31 ENCOUNTER — Ambulatory Visit (INDEPENDENT_AMBULATORY_CARE_PROVIDER_SITE_OTHER): Payer: BC Managed Care – PPO | Admitting: Family

## 2014-03-31 VITALS — BP 102/80 | HR 111 | Temp 99.3°F | Ht 61.0 in | Wt 143.0 lb

## 2014-03-31 DIAGNOSIS — J069 Acute upper respiratory infection, unspecified: Secondary | ICD-10-CM

## 2014-03-31 DIAGNOSIS — H6690 Otitis media, unspecified, unspecified ear: Secondary | ICD-10-CM

## 2014-03-31 MED ORDER — AMOXICILLIN-POT CLAVULANATE 875-125 MG PO TABS: 1.0000 | ORAL_TABLET | Freq: Two times a day (BID) | ORAL | Status: DC

## 2014-03-31 MED ORDER — GUAIFENESIN-CODEINE 100-10 MG/5ML PO SYRP: 5.0000 mL | ORAL_SOLUTION | Freq: Three times a day (TID) | ORAL | Status: DC | PRN

## 2014-03-31 NOTE — Patient Instructions (Signed)
Otitis Media, Adult Otitis media is redness, soreness, and swelling (inflammation) of the middle ear. Otitis media may be caused by allergies or, most commonly, by infection. Often it occurs as a complication of the common cold. SIGNS AND SYMPTOMS Symptoms of otitis media may include:  Earache.  Fever.  Ringing in your ear.  Headache.  Leakage of fluid from the ear. DIAGNOSIS To diagnose otitis media, your health care provider will examine your ear with an otoscope. This is an instrument that allows your health care provider to see into your ear in order to examine your eardrum. Your health care provider also will ask you questions about your symptoms. TREATMENT  Typically, otitis media resolves on its own within 3 5 days. Your health care provider may prescribe medicine to ease your symptoms of pain. If otitis media does not resolve within 5 days or is recurrent, your health care provider may prescribe antibiotic medicines if he or she suspects that a bacterial infection is the cause. HOME CARE INSTRUCTIONS   Take your medicine as directed until it is gone, even if you feel better after the first few days.  Only take over-the-counter or prescription medicines for pain, discomfort, or fever as directed by your health care provider.  Follow up with your health care provider as directed. SEEK MEDICAL CARE IF:  You have otitis media only in one ear or bleeding from your nose or both.  You notice a lump on your neck.  You are not getting better in 3 5 days.  You feel worse instead of better. SEEK IMMEDIATE MEDICAL CARE IF:   You have pain that is not controlled with medicine.  You have swelling, redness, or pain around your ear or stiffness in your neck.  You notice that part of your face is paralyzed.  You notice that the bone behind your ear (mastoid) is tender when you touch it. MAKE SURE YOU:   Understand these instructions.  Will watch your condition.  Will get help  right away if you are not doing well or get worse. Document Released: 07/28/2004 Document Revised: 08/13/2013 Document Reviewed: 05/20/2013 [employer] Patient Information 2014 [employer].  

## 2014-03-31 NOTE — Progress Notes (Signed)
Pre visit review using our clinic review tool, if applicable. No additional management support is needed unless otherwise documented below in the visit note. 

## 2014-03-31 NOTE — Progress Notes (Signed)
Subjective:    Patient ID: Victoria Yoder, female    DOB: October 26, 1986, 28 y.o.   MRN: [id number]  URI  This is a new problem. The current episode started in the past 7 days. The problem has been gradually improving. There has been no fever. The fever has been present for 1 to 2 days. Associated symptoms include congestion, coughing, ear pain, sneezing and a sore throat. She has tried antihistamine, decongestant and NSAIDs for the symptoms.      Review of Systems  Constitutional: Positive for fever, chills and fatigue.  HENT: Positive for congestion, ear pain, sneezing and sore throat.   Respiratory: Positive for cough.   Cardiovascular: Negative.   Gastrointestinal: Negative.   Musculoskeletal: Negative.   Skin: Negative.   Allergic/Immunologic: Negative.   Neurological: Negative.   Psychiatric/Behavioral: Negative.    Past Medical History  Diagnosis Date  . Environmental allergies   . Heartburn   . Asthma   . Chicken pox   . Migraine     History   Social History  . Marital Status: Single    Spouse Name: N/A    Number of Children: N/A  . Years of Education: N/A   Occupational History  . Not on file.   Social History Main Topics  . Smoking status: Never Smoker   . Smokeless tobacco: Not on file  . Alcohol Use: Not on file  . Drug Use: Not on file  . Sexual Activity: Not on file   Other Topics Concern  . Not on file   Social History Narrative  . No narrative on file    Past Surgical History  Procedure Laterality Date  . Unremarkable.      Family History  Problem Relation Age of Onset  . Diabetes Maternal Grandmother   . Diabetes Paternal Grandmother   . Heart disease Paternal Grandmother   . Hypertension Paternal Grandmother   . Stroke Maternal Grandmother     No Known Allergies  Current Outpatient Prescriptions on File Prior to Visit  Medication Sig Dispense Refill  . cetirizine (ZYRTEC) 10 MG tablet Take 10 mg by mouth as needed.      .  dicyclomine (BENTYL) 20 MG tablet Take 1 tablet (20 mg total) by mouth every 6 (six) hours.  30 tablet  0  . levonorgestrel-ethinyl estradiol (NORDETTE) 0.15-30 MG-MCG tablet Take 1 tablet by mouth daily.  1 Package  11  . Multiple Vitamin (MULTIVITAMIN) tablet Take 1 tablet by mouth daily.      . ondansetron (ZOFRAN) 8 MG tablet Take 1 tablet (8 mg total) by mouth every 8 (eight) hours as needed for nausea.  20 tablet  0   No current facility-administered medications on file prior to visit.    BP 102/80  Pulse 111  Temp(Src) 99.3 F (37.4 C) (Oral)  Ht 5\' 1"  (1.549 m)  Wt 143 lb (64.864 kg)  BMI 27.03 kg/m2  SpO2 99%  LMP 05/23/2015chart    Objective:   Physical Exam  Constitutional: She is oriented to person, place, and time. She appears well-developed and well-nourished.  HENT:  Left Ear: External ear normal.  Nose: Nose normal.  Mouth/Throat: Oropharynx is clear and moist.  Right bulging TM  Neck: Normal range of motion. Neck supple.  Cardiovascular: Normal rate, regular rhythm and normal heart sounds.   Pulmonary/Chest: Effort normal.  Neurological: She is alert and oriented to person, place, and time.  Skin: Skin is warm and dry.  Psychiatric: She has a  normal mood and affect.          Assessment & Plan:  Victoria Yoder was seen today for uri.  Diagnoses and associated orders for this visit:  Otitis media  Acute upper respiratory infections of unspecified site  Other Orders - guaiFENesin-codeine (CHERATUSSIN AC) 100-10 MG/5ML syrup; Take 5 mLs by mouth 3 (three) times daily as needed. - amoxicillin-clavulanate (AUGMENTIN) 875-125 MG per tablet; Take 1 tablet by mouth 2 (two) times daily.   Call the office with any questions or concerns.

## 2014-04-02 ENCOUNTER — Encounter: Payer: Self-pay | Admitting: Internal Medicine

## 2014-04-02 ENCOUNTER — Ambulatory Visit (INDEPENDENT_AMBULATORY_CARE_PROVIDER_SITE_OTHER): Payer: BC Managed Care – PPO | Admitting: Internal Medicine

## 2014-04-02 VITALS — BP 122/70 | Temp 98.5°F | Ht 61.0 in | Wt 143.0 lb

## 2014-04-02 DIAGNOSIS — B309 Viral conjunctivitis, unspecified: Secondary | ICD-10-CM

## 2014-04-02 NOTE — Patient Instructions (Signed)
Contact our office if your pink eye symptoms get worse. Use over the counter saline (sensitive eye saline) to flush your eye as directed

## 2014-04-02 NOTE — Progress Notes (Signed)
   Subjective:    Patient ID: Victoria Yoder, female    DOB: April 30, 1986, 28 y.o.   MRN: [id number]  Conjunctivitis   28 year old [place] American female recently diagnosed with right otitis media presents with left pink eye. Her symptoms started this morning. Patient denies any pain in the right eye. No changes in vision or eye discharge.  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  She wears corrective glasses but does not wear contact lenses.  Review of Systems Mild associated itching    Past Medical History  Diagnosis Date  . Environmental allergies   . Heartburn   . Asthma   . Chicken pox   . Migraine     History   Social History  . Marital Status: Single    Spouse Name: N/A    Number of Children: N/A  . Years of Education: N/A   Occupational History  . Not on file.   Social History Main Topics  . Smoking status: Never Smoker   . Smokeless tobacco: Not on file  . Alcohol Use: Not on file  . Drug Use: Not on file  . Sexual Activity: Not on file   Other Topics Concern  . Not on file   Social History Narrative  . No narrative on file    Past Surgical History  Procedure Laterality Date  . Unremarkable.      Family History  Problem Relation Age of Onset  . Diabetes Maternal Grandmother   . Diabetes Paternal Grandmother   . Heart disease Paternal Grandmother   . Hypertension Paternal Grandmother   . Stroke Maternal Grandmother     No Known Allergies  Current Outpatient Prescriptions on File Prior to Visit  Medication Sig Dispense Refill  . amoxicillin-clavulanate (AUGMENTIN) 875-125 MG per tablet Take 1 tablet by mouth 2 (two) times daily.  20 tablet  0  . cetirizine (ZYRTEC) 10 MG tablet Take 10 mg by mouth as needed.      . dicyclomine (BENTYL) 20 MG tablet Take 1 tablet (20 mg total) by mouth every 6 (six) hours.  30 tablet  0  . guaiFENesin-codeine (CHERATUSSIN AC) 100-10 MG/5ML syrup Take 5 mLs by mouth 3 (three) times  daily as needed.  120 mL  0  . levonorgestrel-ethinyl estradiol (NORDETTE) 0.15-30 MG-MCG tablet Take 1 tablet by mouth daily.  1 Package  11  . Multiple Vitamin (MULTIVITAMIN) tablet Take 1 tablet by mouth daily.      . ondansetron (ZOFRAN) 8 MG tablet Take 1 tablet (8 mg total) by mouth every 8 (eight) hours as needed for nausea.  20 tablet  0   No current facility-administered medications on file prior to visit.    BP 122/70  Temp(Src) 98.5 F (36.9 C) (Oral)  Ht 5\' 1"  (1.549 m)  Wt 143 lb (64.864 kg)  BMI 27.03 kg/m2  LMP 03/28/2014    Objective:   Physical Exam  Constitutional: She appears well-developed and well-nourished.  HENT:  Head: Normocephalic and atraumatic.  Right Ear: External ear normal.  Left Ear: External ear normal.  Eyes: EOM are normal. Pupils are equal, round, and reactive to light.  Mild injection of left conjunctiva  Cardiovascular: Normal rate, regular rhythm and normal heart sounds.   Pulmonary/Chest: Effort normal and breath sounds normal. She has no wheezes.          Assessment & Plan:

## 2014-04-02 NOTE — Assessment & Plan Note (Signed)
28 year old [place] American female with signs and symptoms of left viral conjunctivitis.  I recommended symptomatic treatment.  Handout for viral conjunctivitis provided. Patient advised to call office if symptoms persist or worsen.

## 2014-11-14 ENCOUNTER — Ambulatory Visit: Payer: Self-pay | Admitting: Internal Medicine

## 2014-11-14 ENCOUNTER — Encounter: Payer: Self-pay | Admitting: Family Medicine

## 2014-11-14 ENCOUNTER — Ambulatory Visit (INDEPENDENT_AMBULATORY_CARE_PROVIDER_SITE_OTHER): Payer: BLUE CROSS/BLUE SHIELD | Admitting: Family Medicine

## 2014-11-14 VITALS — BP 120/80 | HR 69 | Temp 98.4°F | Resp 18 | Ht 61.0 in | Wt 144.8 lb

## 2014-11-14 DIAGNOSIS — A499 Bacterial infection, unspecified: Secondary | ICD-10-CM

## 2014-11-14 DIAGNOSIS — B9689 Other specified bacterial agents as the cause of diseases classified elsewhere: Secondary | ICD-10-CM

## 2014-11-14 DIAGNOSIS — N760 Acute vaginitis: Secondary | ICD-10-CM

## 2014-11-14 MED ORDER — METRONIDAZOLE 500 MG PO TABS: 500.0000 mg | ORAL_TABLET | Freq: Two times a day (BID) | ORAL | Status: DC

## 2014-11-14 NOTE — Progress Notes (Signed)
Pre visit review using our clinic review tool, if applicable. No additional management support is needed unless otherwise documented below in the visit note. 

## 2014-11-14 NOTE — Patient Instructions (Addendum)
If symptoms do not resolve within 7 days, please come see [place]. Sometimes people develop yeast infetions after antibiotics so let us know if you start having discharge or recurrent itching. If you wanted to be seen in the office we could officially test at that time, sorry we are unable on Saturday clinic.   Bacterial Vaginosis Bacterial vaginosis is a vaginal infection that occurs when the normal balance of bacteria in the vagina is disrupted. It results from an overgrowth of certain bacteria. This is the most common vaginal infection in women of childbearing age. Treatment is important to prevent complications, especially in pregnant women, as it can cause a premature delivery. CAUSES  Bacterial vaginosis is caused by an increase in harmful bacteria that are normally present in smaller amounts in the vagina. Several different kinds of bacteria can cause bacterial vaginosis. However, the reason that the condition develops is not fully understood. RISK FACTORS Certain activities or behaviors can put you at an increased risk of developing bacterial vaginosis, including:  Having a new sex partner or multiple sex partners.  Douching.  Using an intrauterine device (IUD) for contraception. Women do not get bacterial vaginosis from toilet seats, bedding, swimming pools, or contact with objects around them. SIGNS AND SYMPTOMS  Some women with bacterial vaginosis have no signs or symptoms. Common symptoms include:  Grey vaginal discharge.  A fishlike odor with discharge, especially after sexual intercourse.  Itching or burning of the vagina and vulva.  Burning or pain with urination. DIAGNOSIS  Your health care provider will take a medical history and examine the vagina for signs of bacterial vaginosis. A sample of vaginal fluid may be taken. Your health care provider will look at this sample under a microscope to check for bacteria and abnormal cells. A vaginal pH test may also be done.  TREATMENT   Bacterial vaginosis may be treated with antibiotic medicines. These may be given in the form of a pill or a vaginal cream. A second round of antibiotics may be prescribed if the condition comes back after treatment.  HOME CARE INSTRUCTIONS   Only take over-the-counter or prescription medicines as directed by your health care provider.  If antibiotic medicine was prescribed, take it as directed. Make sure you finish it even if you start to feel better.  Do not have sex until treatment is completed.  Tell all sexual partners that you have a vaginal infection. They should see their health care provider and be treated if they have problems, such as a mild rash or itching.  Practice safe sex by using condoms and only having one sex partner. SEEK MEDICAL CARE IF:   Your symptoms are not improving after 3 days of treatment.  You have increased discharge or pain.  You have a fever. MAKE SURE YOU:   Understand these instructions.  Will watch your condition.  Will get help right away if you are not doing well or get worse. FOR MORE INFORMATION  Centers for Disease Control and Prevention, Division of STD Prevention: [employer]raud[employer] American Sexual Health Association (ASHA): www.ashastd.org  Document Released: 10/23/2005 Document Revised: 08/13/2013 Document Reviewed: 06/04/2013 [facility] Patient Information 2015 [place], [place]. This information is not intended to replace advice given to you by your health care provider. Make sure you discuss any questions you have with your health care provider.

## 2014-11-14 NOTE — Progress Notes (Signed)
[facility]  Subjective:  Victoria Yoder is a 29 y.o. year old very pleasant female patient who presents with vaginal irritation  Similar symptoms in 2009. Told she had a ph balance. Think she may have taken an antibiotic. Freshman year at [employer] had BV.  Has external itching and an odor. No discharge.  Concerned about potential yeast infection.  Fishy or metallic smell to it.  LMP 11/04/14 and symptoms started a few days before period, slightly getting better at this point. Itching slightly lower but odor persisits.   Married sexually active 1 partner. No concerns infidelity. No history STDs .  ROS- no dysuria, polyuria. No fever/chills/nausea/vomiting/abdominal pain.    Past Medical History- seasonal allergies on claritin, otherwise healthy  Medications- reviewed  Current Outpatient Prescriptions  Medication Sig Dispense Refill  . loratadine (CLARITIN) 10 MG tablet Take 10 mg by mouth as needed for allergies.     No current facility-administered medications for this visit.    Objective: BP 120/80 mmHg  Pulse 69  Temp(Src) 98.4 F (36.9 C) (Oral)  Resp 18  Ht 5\' 1"  (1.549 m)  Wt 144 lb 12 oz (65.658 kg)  BMI 27.36 kg/m2  SpO2 98% Gen: NAD, resting comfortably in chair CV: RRR no murmurs rubs or gallops Lungs: CTAB no crackles, wheeze, rhonchi Abdomen: soft/nontender/nondistended/normal bowel sounds. No rebound or guarding.  GU: patient declines as no testing available Skin: warm, dry, no rash   Assessment/Plan:  Bacterial vaginosis based off symptoms/history. No testing available and patient declined vaginal exam. Flagyl for 7 days. If no relief or worsening, will return for in the week evaluation with plans for yeast/bv/trichomonas testing.   Meds ordered this encounter  Medications  . loratadine (CLARITIN) 10 MG tablet    Sig: Take 10 mg by mouth as needed for allergies.  . metroNIDAZOLE (FLAGYL) 500 MG tablet    Sig: Take 1 tablet (500 mg  total) by mouth 2 (two) times daily. Do not use alcohol while taking this medication.    Dispense:  14 tablet    Refill:  0

## 2015-05-19 ENCOUNTER — Ambulatory Visit (INDEPENDENT_AMBULATORY_CARE_PROVIDER_SITE_OTHER): Payer: BC Managed Care – PPO | Admitting: Adult Health

## 2015-05-19 ENCOUNTER — Encounter: Payer: Self-pay | Admitting: Adult Health

## 2015-05-19 VITALS — BP 120/80 | Temp 98.6°F | Ht 61.0 in | Wt 153.1 lb

## 2015-05-19 DIAGNOSIS — R300 Dysuria: Secondary | ICD-10-CM

## 2015-05-19 DIAGNOSIS — N3001 Acute cystitis with hematuria: Secondary | ICD-10-CM | POA: Diagnosis not present

## 2015-05-19 LAB — POCT URINALYSIS DIPSTICK
Bilirubin, UA: NEGATIVE
Glucose, UA: NEGATIVE
Ketones, UA: NEGATIVE
Nitrite, UA: NEGATIVE
PROTEIN UA: NEGATIVE
RBC UA: NEGATIVE
SPEC GRAV UA: 1.015
Urobilinogen, UA: 0.2
pH, UA: 7.5

## 2015-05-19 MED ORDER — NITROFURANTOIN MACROCRYSTAL 100 MG PO CAPS: 100.0000 mg | ORAL_CAPSULE | Freq: Two times a day (BID) | ORAL | Status: DC

## 2015-05-19 NOTE — Progress Notes (Signed)
DJM:[facility] Victoria Croft, FNP Chief Complaint  Patient presents with  . Dysuria    and lower back pain started on yesterday     Current Issues:  Presents with 1 day of dysuria and urinary frequency Associated symptoms include:  dysuria, hematuria, lower abdominal pain and urinary frequency  There is no history of of similar symptoms. Sexually active:  Yes with female.   No concern for STI.  Prior to Admission medications   Medication Sig Start Date End Date Taking? Authorizing Provider  loratadine (CLARITIN) 10 MG tablet Take 10 mg by mouth as needed for allergies.    Historical Provider, MD    Review of Systems:Negative unless otherwise mentioned.   PE:  BP 120/80 mmHg  Temp(Src) 98.6 F (37 C) (Oral)  Ht 5\' 1"  (1.549 m)  Wt 153 lb 1.6 oz (69.446 kg)  BMI 28.94 kg/m2 Constitutional: Well developed female in NAD Heart Normal rate and rhythm. No murmur, gallop, or run heard Lungs: Clear to auscultation full fields.  Back: No CVA tenderness Abdomen: No pain with palpation. No masses, no distension.   Results for orders placed or performed in visit on 05/19/15  POCT urinalysis dipstick  Result Value Ref Range   Color, UA Yellow    Clarity, UA Clear    Glucose, UA Negative    Bilirubin, UA Negative    Ketones, UA Negative    Spec Grav, UA 1.015    Blood, UA Negative    pH, UA 7.5    Protein, UA Negative    Urobilinogen, UA 0.2    Nitrite, UA Negative    Leukocytes, UA Trace (A) Negative    1. Dysuria - POCT urinalysis dipstick; Standing - Culture, Urine - nitrofurantoin (MACRODANTIN) 100 MG capsule; Take 1 capsule (100 mg total) by mouth 2 (two) times daily.  Dispense: 10 capsule; Refill: 0  2. Acute cystitis with hematuria [N30.01] - POCT urinalysis dipstick; Standing - Culture, Urine - nitrofurantoin (MACRODANTIN) 100 MG capsule; Take 1 capsule (100 mg total) by mouth 2 (two) times daily.  Dispense: 10 capsule; Refill: 0 - Will follow up with patient once culture is  back - OTC Azo - Ibuprofen 600mg  for symptom relief.

## 2015-05-19 NOTE — Progress Notes (Signed)
Pre visit review using our clinic review tool, if applicable. No additional management support is needed unless otherwise documented below in the visit note. 

## 2015-05-19 NOTE — Patient Instructions (Signed)

## 2015-05-21 ENCOUNTER — Encounter: Payer: Self-pay | Admitting: Adult Health

## 2015-05-21 LAB — URINE CULTURE: Colony Count: 9000

## 2015-08-22 ENCOUNTER — Encounter (HOSPITAL_COMMUNITY): Payer: Self-pay | Admitting: *Deleted

## 2015-08-22 ENCOUNTER — Emergency Department (HOSPITAL_COMMUNITY)
Admission: EM | Admit: 2015-08-22 | Discharge: 2015-08-22 | Disposition: A | Discharge status: Home / Self Care | Payer: BC Managed Care – PPO | Source: Home / Self Care | Attending: Family Medicine | Admitting: Family Medicine

## 2015-08-22 DIAGNOSIS — R110 Nausea: Secondary | ICD-10-CM

## 2015-08-22 DIAGNOSIS — R0789 Other chest pain: Secondary | ICD-10-CM | POA: Diagnosis not present

## 2015-08-22 DIAGNOSIS — R42 Dizziness and giddiness: Secondary | ICD-10-CM

## 2015-08-22 LAB — POCT URINALYSIS DIP (DEVICE)
Bilirubin Urine: NEGATIVE
Glucose, UA: NEGATIVE mg/dL
Hgb urine dipstick: NEGATIVE
Ketones, ur: NEGATIVE mg/dL
Nitrite: NEGATIVE
Protein, ur: NEGATIVE mg/dL
Specific Gravity, Urine: 1.015 (ref 1.005–1.030)
Urobilinogen, UA: 0.2 mg/dL (ref 0.0–1.0)
pH: 7.5 (ref 5.0–8.0)

## 2015-08-22 LAB — POCT PREGNANCY, URINE: Preg Test, Ur: NEGATIVE

## 2015-08-22 LAB — POCT I-STAT, CHEM 8
BUN: 9 mg/dL (ref 6–20)
Calcium, Ion: 1.21 mmol/L (ref 1.12–1.23)
Chloride: 102 mmol/L (ref 101–111)
Creatinine, Ser: 0.8 mg/dL (ref 0.44–1.00)
Glucose, Bld: 110 mg/dL — ABNORMAL HIGH (ref 65–99)
HCT: 44 % (ref 36.0–46.0)
Hemoglobin: 15 g/dL (ref 12.0–15.0)
Potassium: 3.9 mmol/L (ref 3.5–5.1)
Sodium: 139 mmol/L (ref 135–145)
TCO2: 25 mmol/L (ref 0–100)

## 2015-08-22 MED ORDER — OMEPRAZOLE 20 MG PO CPDR: 20.0000 mg | DELAYED_RELEASE_CAPSULE | Freq: Every day | ORAL | Status: DC

## 2015-08-22 NOTE — ED Notes (Signed)
C/O intermittent episodes of dizziness x 5 days; denies any dizziness at present.  Today started with SOB - "feeling like I can't catch my breath".  Denies any pain.  Also c/o some nausea, nocturia.

## 2015-08-22 NOTE — ED Provider Notes (Signed)
CSN: [id number]     Arrival date & time 08/22/15  1700 History   First MD Initiated Contact with Patient 08/22/15 1710     Chief Complaint  Patient presents with  . Shortness of Breath  . Dizziness   (Consider location/radiation/quality/duration/timing/severity/associated sxs/prior Treatment) Patient is a 29 y.o. female presenting with shortness of breath and dizziness. The history is provided by the patient.  Shortness of Breath Severity:  Moderate Onset quality:  Sudden Duration:  1 week Timing:  Intermittent Progression:  Waxing and waning Chronicity:  New Context: not activity, not known allergens, not occupational exposure, not pollens, not strong odors and not URI   Associated symptoms: no abdominal pain, no claudication, no cough, no diaphoresis, no ear pain, no fever, no sore throat, no sputum production, no syncope, no swollen glands, no vomiting and no wheezing   Risk factors: no recent alcohol use, no family hx of DVT, no hx of cancer, no hx of PE/DVT, no obesity, no oral contraceptive use, no prolonged immobilization, no recent surgery and no tobacco use   Dizziness Associated symptoms: nausea and shortness of breath   Associated symptoms: no syncope and no vomiting    this is a 29 year old married woman who comes in with intermittent chest tightness over the past week. It's associated with nausea. 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  She had asthma as a child. She has had no wheezing this week.  Patient has had some reflux symptoms intermittently over the past week which she has not related to the tightness.  Past Medical History  Diagnosis Date  . Environmental allergies   . Heartburn   . Asthma   . Chicken pox   . Migraine    Past Surgical History  Procedure Laterality Date  . Unremarkable.     Family History  Problem Relation Age of Onset  . Diabetes Maternal Grandmother   . Diabetes Paternal  Grandmother   . Heart disease Paternal Grandmother   . Hypertension Paternal Grandmother   . Stroke Maternal Grandmother    Social History  Substance Use Topics  . Smoking status: Never Smoker   . Smokeless tobacco: None  . Alcohol Use: Yes     Comment: occasional   OB History    No data available     Review of Systems  Constitutional: Negative for fever and diaphoresis.  HENT: Negative for ear pain and sore throat.   Respiratory: Positive for shortness of breath. Negative for cough, sputum production and wheezing.   Cardiovascular: Negative for claudication and syncope.  Gastrointestinal: Positive for nausea. Negative for vomiting and abdominal pain.  Neurological: Positive for dizziness.    Allergies  Review of patient's allergies indicates no known allergies.  Home Medications   Prior to Admission medications   Medication Sig Start Date End Date Taking? Authorizing Provider  loratadine (CLARITIN) 10 MG tablet Take 10 mg by mouth as needed for allergies.    Historical Provider, MD  nitrofurantoin (MACRODANTIN) 100 MG capsule Take 1 capsule (100 mg total) by mouth 2 (two) times daily. 05/19/15   Dorothyann Peng, NP   Meds Ordered and Administered this Visit  Medications - No data to display  BP 130/87 mmHg  Pulse 82  Temp(Src) 98.9 F (37.2 C) (Oral)  Resp 20  SpO2 100%  LMP 07/30/2015 (Exact Date) No data found.   Physical Exam  Constitutional: She is oriented to person, place, and time.  She appears well-developed and well-nourished.  HENT:  Head: Normocephalic and atraumatic.  Right Ear: External ear normal.  Left Ear: External ear normal.  Mouth/Throat: Oropharynx is clear and moist.  Eyes: Conjunctivae and EOM are normal. Pupils are equal, round, and reactive to light.  Neck: Normal range of motion. Neck supple. No tracheal deviation present. No thyromegaly present.  Cardiovascular: Normal rate, regular rhythm, normal heart sounds and intact distal pulses.    Pulmonary/Chest: Effort normal and breath sounds normal.  Abdominal: Soft. Bowel sounds are normal.  Musculoskeletal: Normal range of motion.  Lymphadenopathy:    She has no cervical adenopathy.  Neurological: She is oriented to person, place, and time.  Skin: Skin is warm and dry.  Psychiatric: She has a normal mood and affect. Her behavior is normal. Thought content normal.  Nursing note and vitals reviewed.   ED Course  Procedures (including critical care time)  Labs Review Labs Reviewed - No data to display Results for orders placed or performed during the hospital encounter of 08/22/15  POCT urinalysis dip (device)  Result Value Ref Range   Glucose, UA NEGATIVE NEGATIVE mg/dL   Bilirubin Urine NEGATIVE NEGATIVE   Ketones, ur NEGATIVE NEGATIVE mg/dL   Specific Gravity, Urine 1.015 1.005 - 1.030   Hgb urine dipstick NEGATIVE NEGATIVE   pH 7.5 5.0 - 8.0   Protein, ur NEGATIVE NEGATIVE mg/dL   Urobilinogen, UA 0.2 0.0 - 1.0 mg/dL   Nitrite NEGATIVE NEGATIVE   Leukocytes, UA TRACE (A) NEGATIVE  Pregnancy, urine POC  Result Value Ref Range   Preg Test, Ur NEGATIVE NEGATIVE  I-STAT, chem 8  Result Value Ref Range   Sodium 139 135 - 145 mmol/L   Potassium 3.9 3.5 - 5.1 mmol/L   Chloride 102 101 - 111 mmol/L   BUN 9 6 - 20 mg/dL   Creatinine, Ser 0.80 0.44 - 1.00 mg/dL   Glucose, Bld 110 (H) 65 - 99 mg/dL   Calcium, Ion 1.21 1.12 - 1.23 mmol/L   TCO2 25 0 - 100 mmol/L   Hemoglobin 15.0 12.0 - 15.0 g/dL   HCT 44.0 36.0 - 46.0 %       MDM      ICD-9-CM ICD-10-CM   1. Chest tightness 786.59 R07.89   2. Dizziness 780.4 R42   3. Nausea 787.02 R11.0      Signed, Robyn Haber, MD   Robyn Haber, MD 08/22/15 [phone]

## 2015-08-22 NOTE — Discharge Instructions (Signed)
Please see her personal care provider if your symptoms continue. I am presuming that your symptoms are coming from reflux which continue all his symptoms of described. Nevertheless there are some other less common problem secondary to this and if the Prilosec doesn't control your symptoms, it's time for further investigation

## 2015-10-08 ENCOUNTER — Encounter: Payer: Self-pay | Admitting: Internal Medicine

## 2015-10-08 ENCOUNTER — Other Ambulatory Visit (INDEPENDENT_AMBULATORY_CARE_PROVIDER_SITE_OTHER): Payer: BC Managed Care – PPO

## 2015-10-08 ENCOUNTER — Ambulatory Visit (INDEPENDENT_AMBULATORY_CARE_PROVIDER_SITE_OTHER): Payer: BC Managed Care – PPO | Admitting: Internal Medicine

## 2015-10-08 VITALS — BP 118/88 | HR 84 | Temp 98.1°F | Resp 20 | Ht 61.0 in | Wt 151.5 lb

## 2015-10-08 DIAGNOSIS — Z0000 Encounter for general adult medical examination without abnormal findings: Secondary | ICD-10-CM

## 2015-10-08 DIAGNOSIS — G43909 Migraine, unspecified, not intractable, without status migrainosus: Secondary | ICD-10-CM | POA: Insufficient documentation

## 2015-10-08 DIAGNOSIS — Z23 Encounter for immunization: Secondary | ICD-10-CM | POA: Diagnosis not present

## 2015-10-08 LAB — CBC WITH DIFFERENTIAL/PLATELET
BASOS PCT: 0.4 % (ref 0.0–3.0)
Basophils Absolute: 0.1 10*3/uL (ref 0.0–0.1)
EOS PCT: 0.3 % (ref 0.0–5.0)
Eosinophils Absolute: 0.1 10*3/uL (ref 0.0–0.7)
HEMATOCRIT: 38.7 % (ref 36.0–46.0)
HEMOGLOBIN: 12.8 g/dL (ref 12.0–15.0)
Lymphocytes Relative: 15.7 % (ref 12.0–46.0)
Lymphs Abs: 2.4 10*3/uL (ref 0.7–4.0)
MCHC: 33.2 g/dL (ref 30.0–36.0)
MCV: 87.4 fl (ref 78.0–100.0)
MONO ABS: 0.8 10*3/uL (ref 0.1–1.0)
MONOS PCT: 5 % (ref 3.0–12.0)
Neutro Abs: 12.1 10*3/uL — ABNORMAL HIGH (ref 1.4–7.7)
Neutrophils Relative %: 78.6 % — ABNORMAL HIGH (ref 43.0–77.0)
Platelets: 351 10*3/uL (ref 150.0–400.0)
RBC: 4.43 Mil/uL (ref 3.87–5.11)
RDW: 13.4 % (ref 11.5–15.5)
WBC: 15.4 10*3/uL — AB (ref 4.0–10.5)

## 2015-10-08 LAB — LIPID PANEL
CHOLESTEROL: 143 mg/dL (ref 0–200)
HDL: 53 mg/dL (ref >39.00)
LDL CALC: 80 mg/dL (ref 0–99)
NonHDL: 89.7
TRIGLYCERIDES: 49 mg/dL (ref 0.0–149.0)
Total CHOL/HDL Ratio: 3
VLDL: 9.8 mg/dL (ref 0.0–40.0)

## 2015-10-08 LAB — COMPREHENSIVE METABOLIC PANEL
ALBUMIN: 4.2 g/dL (ref 3.5–5.2)
ALK PHOS: 47 U/L (ref 39–117)
ALT: 9 U/L (ref 0–35)
AST: 13 U/L (ref 0–37)
BUN: 12 mg/dL (ref 6–23)
CALCIUM: 9.1 mg/dL (ref 8.4–10.5)
CHLORIDE: 102 meq/L (ref 96–112)
CO2: 26 mEq/L (ref 19–32)
Creatinine, Ser: 0.67 mg/dL (ref 0.40–1.20)
GFR: 131.95 mL/min (ref >60.00)
Glucose, Bld: 76 mg/dL (ref 70–99)
POTASSIUM: 3.7 meq/L (ref 3.5–5.1)
Sodium: 136 mEq/L (ref 135–145)
TOTAL PROTEIN: 7.6 g/dL (ref 6.0–8.3)
Total Bilirubin: 0.5 mg/dL (ref 0.2–1.2)

## 2015-10-08 LAB — TSH: TSH: 0.78 u[IU]/mL (ref 0.35–4.50)

## 2015-10-08 MED ORDER — PRE-NATAL FORMULA PO TABS: ORAL_TABLET | ORAL | Status: AC

## 2015-10-08 NOTE — Patient Instructions (Addendum)
  We have reviewed your prior records including labs and tests today.  Test(s) ordered today. Your results will be released to [place] (or called to you) after review, usually within 72hours after test completion. If any changes need to be made, you will be notified at that same time.  All other Health Maintenance issues reviewed.   All recommended immunizations and age-appropriate screenings are up-to-date.  Flu vaccine administered today.   Medications reviewed and updated.  No changes recommended at this time.

## 2015-10-08 NOTE — Progress Notes (Signed)
Subjective:    Patient ID: Victoria Yoder, female    DOB: [birth year], 31 y.o.   MRN: [id number]  HPI She is here to establish with a new pcp and for a physical.   She has had intermittent palpitaitons a couple of weeks ago and it last for four days.  The heartbeat felt irregular at that time.  She has not had any since then.     4 weeks ago she went to urgent care for sob, chest tightness and dizziness.  She was dagnosed with GERD.  She took otc zantac.  She took it for a day or two and her symptoms resvoled.  She has not had any GERD since then.  She has had GERD in the past and never had those symptoms.    She is unsure why the above two events occurred and is concerned about the possible cause.   She is trying to get pregnant.  She currently has her menses and it is lasting longer than usual.  She was seeing a NP in the past that did her pap smears. She is not following with an ob/gyn.  Medications and allergies reviewed with patient and updated if appropriate.  Patient Active Problem List   Diagnosis Date Noted  . Migraine 10/08/2015  . Allergic rhinitis 12/06/2012    No current outpatient prescriptions on file prior to visit.   No current facility-administered medications on file prior to visit.    Past Medical History  Diagnosis Date  . Environmental allergies   . Heartburn   . Asthma   . Chicken pox   . Migraine     Past Surgical History  Procedure Laterality Date  . Unremarkable.      Social History   Social History  . Marital Status: Single    Spouse Name: N/A  . Number of Children: N/A  . Years of Education: N/A   Social History Main Topics  . Smoking status: Never Smoker   . Smokeless tobacco: None  . Alcohol Use: Yes     Comment: occasional  . Drug Use: No  . Sexual Activity: Yes   Other Topics Concern  . 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  HENT: Negative for hearing loss.   Eyes: Negative for visual disturbance.  Respiratory: Positive for shortness of breath (related to GERD). Negative for cough and wheezing.   Cardiovascular: Positive for palpitations (intermittent for four days last month). Negative for chest pain and leg swelling.  Gastrointestinal: Negative for nausea, abdominal pain, diarrhea, constipation and blood in stool.       No GERD now  Genitourinary: Negative for dysuria and hematuria.  Musculoskeletal: Positive for back pain (lower back - with menses). Negative for arthralgias.  Neurological: Positive for dizziness, light-headedness (related to GERD) and headaches (migraines once a month - hormonal - tylenol).  Psychiatric/Behavioral: Negative for dysphoric mood. The patient is not nervous/anxious.        Objective:   Filed Vitals:   10/08/15 1534  BP: 118/88  Pulse: 84  Temp: 98.1 F (36.7 C)  Resp: 20   Filed Weights   10/08/15 1534  Weight: 151 lb 8 oz (68.72 kg)   Body mass index is 28.64 kg/(m^2).   Physical Exam Constitutional: She appears well-developed and well-nourished. No  distress.  HENT:  Head: Normocephalic and atraumatic.  Right Ear: External ear normal.  Left Ear: External ear normal.  Mouth/Throat: Oropharynx is clear and moist.  Normal bilateral ear canals and tympanic membranes  Eyes: Conjunctivae and EOM are normal.  Neck: Neck supple. No tracheal deviation present. No thyromegaly present.  No carotid bruit  Cardiovascular: Normal rate, regular rhythm and normal heart sounds.   No murmur heard. Pulmonary/Chest: Effort normal and breath sounds normal. No respiratory distress. She has no wheezes. She has no rales.  Abdominal: Soft. She exhibits no distension. There is no tenderness.  Musculoskeletal: She exhibits no edema.  Lymphadenopathy:    She has no cervical adenopathy.  Skin: Skin is warm and  dry. She is not diaphoretic.  Psychiatric: She has a normal mood and affect. Her behavior is normal.         Assessment & Plan:   Physical exam: Screening blood work ordered Immunizations: td up to date, flu today Exercise: none currently; encouraged regular exercise Weight: slightly overweight; encouraged exercise Skin: no changes or concerns, advised monitoring skin Substance abuse: no concerns Encouraged her to establish with an OB/GYN  She had an episode of palpitations that was intermittent and lasted for 4 days. This was an isolated incident and she has not had any other palpitations. Will check blood work. If symptoms recur and become more frequent we'll consider further evaluation including Holter monitor, possible echo or cardiology referral  Episode of shortness of breath, chest tightness and dizziness-urgent care she was diagnosed with GERD, which is possible. Her symptoms have resolved and they have not recurred. She took Zantac a few times and wheezing as needed Advised her to follow-up if her symptoms recur

## 2015-10-08 NOTE — Progress Notes (Signed)
Pre visit review using our clinic review tool, if applicable. No additional management support is needed unless otherwise documented below in the visit note. 

## 2015-10-10 ENCOUNTER — Encounter: Payer: Self-pay | Admitting: Internal Medicine

## 2015-10-19 ENCOUNTER — Other Ambulatory Visit: Payer: BC Managed Care – PPO

## 2015-10-19 ENCOUNTER — Ambulatory Visit (INDEPENDENT_AMBULATORY_CARE_PROVIDER_SITE_OTHER): Payer: BC Managed Care – PPO | Admitting: Family

## 2015-10-19 ENCOUNTER — Encounter: Payer: Self-pay | Admitting: Family

## 2015-10-19 VITALS — BP 132/88 | HR 96 | Temp 98.7°F | Resp 18 | Ht 61.0 in | Wt 152.0 lb

## 2015-10-19 DIAGNOSIS — R300 Dysuria: Secondary | ICD-10-CM

## 2015-10-19 DIAGNOSIS — A549 Gonococcal infection, unspecified: Secondary | ICD-10-CM

## 2015-10-19 LAB — POCT URINALYSIS DIPSTICK
Bilirubin, UA: NEGATIVE
Glucose, UA: NEGATIVE
KETONES UA: NEGATIVE
Nitrite, UA: NEGATIVE
PH UA: 6.5
PROTEIN UA: NEGATIVE
RBC UA: NEGATIVE
SPEC GRAV UA: 1.015
UROBILINOGEN UA: NEGATIVE

## 2015-10-19 MED ORDER — CIPROFLOXACIN HCL 250 MG PO TABS: 250.0000 mg | ORAL_TABLET | Freq: Two times a day (BID) | ORAL | Status: DC

## 2015-10-19 MED ORDER — PHENAZOPYRIDINE HCL 100 MG PO TABS: 100.0000 mg | ORAL_TABLET | Freq: Three times a day (TID) | ORAL | Status: DC | PRN

## 2015-10-19 NOTE — Addendum Note (Signed)
Addended by: Delice Bison E on: 10/19/2015 04:49 PM   Modules accepted: Orders

## 2015-10-19 NOTE — Progress Notes (Signed)
   Subjective:    Patient ID: Victoria Yoder, female    DOB: [birth year], 29 y.o.   MRN: [id number]  Chief Complaint  Patient presents with  . Dysuria    since sunday she had dysuria, need G/C probe, husband was positive for gonorrhea    HPI:  Victoria Yoder is a 29 y.o. female who  has a past medical history of Environmental allergies; Heartburn; Asthma; Chicken pox; and Migraine. and presents today for an acute office visit.  This is a new problem. Associated symptom of dysuria and flank pain that has been going on for about 2 days. Denies frequency, urgency, or fevers. Denies any modifying factors in attempted treatments. Husband recently had a positive test for gonorrhea and would like to be tested.  No Known Allergies   Current Outpatient Prescriptions on File Prior to Visit  Medication Sig Dispense Refill  . Prenatal Multivit-Min-Fe-FA (PRE-NATAL FORMULA) TABS Taking daily 30 each 0   No current facility-administered medications on file prior to visit.    Review of Systems  Constitutional: Negative for fever and chills.  Genitourinary: Positive for dysuria. Negative for urgency, frequency and hematuria.      Objective:    BP 132/88 mmHg  Pulse 96  Temp(Src) 98.7 F (37.1 C) (Oral)  Resp 18  Ht 5\' 1"  (1.549 m)  Wt 152 lb (68.947 kg)  BMI 28.74 kg/m2  SpO2 97% Nursing note and vital signs reviewed.  Physical Exam  Constitutional: She is oriented to person, place, and time. She appears well-developed and well-nourished. No distress.  Cardiovascular: Normal rate, regular rhythm, normal heart sounds and intact distal pulses.   Pulmonary/Chest: Effort normal and breath sounds normal.  Abdominal: There is no CVA tenderness.  Neurological: She is alert and oriented to person, place, and time.  Skin: Skin is warm and dry.  Psychiatric: She has a normal mood and affect. Her behavior is normal. Judgment and thought content normal.       Assessment & Plan:   Problem  List Items Addressed This Visit      Other   Dysuria - Primary    In office urinalysis positive for leukocytes and negative for nitrites and hematuria. Symptoms of dysuria consistent with urinary tract infection. Start ciprofloxacin. Obtain GC/chlamydia probe secondary to has been testing positive for gonorrhea. Start Pyridium as needed for dysuria. Follow-up if symptoms worsen or fail to improve.      Relevant Medications   ciprofloxacin (CIPRO) 250 MG tablet   phenazopyridine (PYRIDIUM) 100 MG tablet   Other Relevant Orders   CULTURE, URINE COMPREHENSIVE   GC/chlamydia probe amp, urine

## 2015-10-19 NOTE — Progress Notes (Signed)
Pre visit review using our clinic review tool, if applicable. No additional management support is needed unless otherwise documented below in the visit note. 

## 2015-10-19 NOTE — Assessment & Plan Note (Signed)
In office urinalysis positive for leukocytes and negative for nitrites and hematuria. Symptoms of dysuria consistent with urinary tract infection. Start ciprofloxacin. Obtain GC/chlamydia probe secondary to has been testing positive for gonorrhea. Start Pyridium as needed for dysuria. Follow-up if symptoms worsen or fail to improve.

## 2015-10-19 NOTE — Patient Instructions (Signed)
Thank you for choosing [facility].  Summary/Instructions:  Your prescription(s) have been submitted to your pharmacy or been printed and provided for you. Please take as directed and contact our office if you believe you are having problem(s) with the medication(s) or have any questions.  If your symptoms worsen or fail to improve, please contact our office for further instruction, or in case of emergency go directly to the emergency room at the closest medical facility.   Urinary Tract Infection Urinary tract infections (UTIs) can develop anywhere along your urinary tract. Your urinary tract is your body's drainage system for removing wastes and extra water. Your urinary tract includes two kidneys, two ureters, a bladder, and a urethra. Your kidneys are a pair of bean-shaped organs. Each kidney is about the size of your fist. They are located below your ribs, one on each side of your spine. CAUSES Infections are caused by microbes, which are microscopic organisms, including fungi, viruses, and bacteria. These organisms are so small that they can only be seen through a microscope. Bacteria are the microbes that most commonly cause UTIs. SYMPTOMS  Symptoms of UTIs may vary by age and gender of the patient and by the location of the infection. Symptoms in young women typically include a frequent and intense urge to urinate and a painful, burning feeling in the bladder or urethra during urination. Older women and men are more likely to be tired, shaky, and weak and have muscle aches and abdominal pain. A fever may mean the infection is in your kidneys. Other symptoms of a kidney infection include pain in your back or sides below the ribs, nausea, and vomiting. DIAGNOSIS To diagnose a UTI, your caregiver will ask you about your symptoms. Your caregiver will also ask you to provide a urine sample. The urine sample will be tested for bacteria and white blood cells. White blood cells are made by your  body to help fight infection. TREATMENT  Typically, UTIs can be treated with medication. Because most UTIs are caused by a bacterial infection, they usually can be treated with the use of antibiotics. The choice of antibiotic and length of treatment depend on your symptoms and the type of bacteria causing your infection. HOME CARE INSTRUCTIONS  If you were prescribed antibiotics, take them exactly as your caregiver instructs you. Finish the medication even if you feel better after you have only taken some of the medication.  Drink enough water and fluids to keep your urine clear or pale yellow.  Avoid caffeine, tea, and carbonated beverages. They tend to irritate your bladder.  Empty your bladder often. Avoid holding urine for long periods of time.  Empty your bladder before and after sexual intercourse.  After a bowel movement, women should cleanse from front to back. Use each tissue only once. SEEK MEDICAL CARE IF:   You have back pain.  You develop a fever.  Your symptoms do not begin to resolve within 3 days. SEEK IMMEDIATE MEDICAL CARE IF:   You have severe back pain or lower abdominal pain.  You develop chills.  You have nausea or vomiting.  You have continued burning or discomfort with urination. MAKE SURE YOU:   Understand these instructions.  Will watch your condition.  Will get help right away if you are not doing well or get worse.   This information is not intended to replace advice given to you by your health care provider. Make sure you discuss any questions you have with your health care   provider.   Document Released: 08/02/2005 Document Revised: 07/14/2015 Document Reviewed: 12/01/2011 Elsevier Interactive Patient Education 2016 [employer]vier Inc[employer]  

## 2015-10-20 ENCOUNTER — Encounter: Payer: Self-pay | Admitting: Family

## 2015-10-20 ENCOUNTER — Telehealth: Payer: Self-pay | Admitting: Family

## 2015-10-20 LAB — GC/CHLAMYDIA PROBE AMP, URINE
CHLAMYDIA, SWAB/URINE, PCR: NOT DETECTED
GC Probe Amp, Urine: DETECTED — AB

## 2015-10-20 MED ORDER — AZITHROMYCIN 250 MG PO TABS: 1000.0000 mg | ORAL_TABLET | Freq: Once | ORAL | Status: DC

## 2015-10-20 NOTE — Telephone Encounter (Signed)
Pt aware of results 

## 2015-10-20 NOTE — Telephone Encounter (Signed)
Please inform patient that her gonorrhea test is positive. Azithromycin has been sent to her pharmacy.

## 2015-10-21 ENCOUNTER — Encounter: Payer: Self-pay | Admitting: Family

## 2015-10-21 LAB — CULTURE, URINE COMPREHENSIVE
COLONY COUNT: NO GROWTH
Organism ID, Bacteria: NO GROWTH

## 2015-10-27 ENCOUNTER — Ambulatory Visit (INDEPENDENT_AMBULATORY_CARE_PROVIDER_SITE_OTHER): Payer: BC Managed Care – PPO | Admitting: Family

## 2015-10-27 ENCOUNTER — Encounter: Payer: Self-pay | Admitting: Family

## 2015-10-27 VITALS — BP 120/78 | HR 85 | Temp 98.0°F | Resp 18 | Ht 61.0 in | Wt 152.0 lb

## 2015-10-27 DIAGNOSIS — A549 Gonococcal infection, unspecified: Secondary | ICD-10-CM

## 2015-10-27 MED ORDER — CEFTRIAXONE SODIUM 1 G IJ SOLR
500.0000 mg | Freq: Once | INTRAMUSCULAR | Status: AC
  Administered 2015-10-27: 500 mg via INTRAMUSCULAR

## 2015-10-27 MED ORDER — DOXYCYCLINE HYCLATE 100 MG PO TABS: 100.0000 mg | ORAL_TABLET | Freq: Two times a day (BID) | ORAL | Status: DC

## 2015-10-27 NOTE — Progress Notes (Signed)
Pre visit review using our clinic review tool, if applicable. No additional management support is needed unless otherwise documented below in the visit note. 

## 2015-10-27 NOTE — Assessment & Plan Note (Signed)
Symptoms consistent with gonorrhea. In office injection of Rocephin given without complication. Start doxycycline. Follow up if symptoms worsen or fail to improve.

## 2015-10-27 NOTE — Patient Instructions (Addendum)
Thank you for choosing [facility].  Summary/Instructions:  Your prescription(s) have been submitted to your pharmacy or been printed and provided for you. Please take as directed and contact our office if you believe you are having problem(s) with the medication(s) or have any questions.  If your symptoms worsen or fail to improve, please contact our office for further instruction, or in case of emergency go directly to the emergency room at the closest medical facility.   Gonorrhea Gonorrhea is an infection that can cause serious problems. If left untreated, the infection may:   Damage the female or female organs.   Cause women to be unable to have children (sterility).   Harm a fetus if the infected woman is pregnant.  It is important to get treatment for gonorrhea as soon as possible. It is also necessary that all your sexual partners be tested for the infection.  CAUSES  Gonorrhea is caused by bacteria called Neisseria gonorrhoeae. The infection is spread from person to person, usually by sexual contact (such as by anal, vaginal, or oral means). A newborn can contract the infection from his or her mother during birth.  RISK FACTORS  Being a woman younger than 31 years of age who is sexually active.  Being a woman 3 years of age or older who has:  A new sex partner.  More than one sex partner.  A sex partner who has a sexually transmitted disease (STD).  Using condoms inconsistently.  Currently having, or having previously had, an STD.  Exchanging sex or money or drugs. SYMPTOMS  Some people with gonorrhea do not have symptoms. Symptoms may be different in females and males.  Females The most common symptoms are:   Pain in the lower abdomen.   Fever with or without chills.  Other symptoms include:   Abnormal vaginal discharge.   Painful intercourse.   Burning or itching of the vagina or lips of the vagina.   Abnormal vaginal bleeding.   Pain  when urinating.   Long-lasting (chronic) pain in the lower abdomen, especially during menstruation or intercourse.   Inability to become pregnant.   Going into premature labor.   Irritation, pain, bleeding, or discharge from the rectum. This may occur if the infection was spread by anal sex.   Sore throat or swollen lymph nodes in the neck. This may occur if the infection was spread by oral sex.  Males The most common symptoms are:   Discharge from the penis.   Pain or burning during urination.   Pain or swelling in the testicles. Other symptoms may include:   Irritation, pain, bleeding, or discharge from the rectum. This may occur if the infection was spread by anal sex.   Sore throat, fever, or swollen lymph nodes in the neck. This may occur if the infection was spread by oral sex.  DIAGNOSIS  A diagnosis is made after a physical exam is done and a sample of discharge is examined under a microscope for the presence of the bacteria. The discharge may be taken from the urethra, cervix, throat, or rectum.  TREATMENT  Gonorrhea is treated with antibiotic medicines. It is important for treatment to begin as soon as possible. Early treatment may prevent some problems from developing. Do not have sex. Avoid all types of sexual activity for 7 days after treatment is complete and until any sex partners have been treated. HOME CARE INSTRUCTIONS   Take medicines only as directed by your health care provider.  Take your antibiotic medicine as directed by your health care provider. Finish the antibiotic even if you start to feel better. Incomplete treatment will put you at risk for continued infection.   Do not have sex until treatment is complete or as directed by your health care provider.   Keep all follow-up visits as directed by your health care provider.   Not all test results are available during your visit. If your test results are not back during the visit, make an  appointment with your health care provider to find out the results. Do not assume everything is normal if you have not heard from your health care provider or the medical facility. It is your responsibility to get your test results.  If you test positive for gonorrhea, inform your recent sexual partners. They need to be checked for gonorrhea even if they do not have symptoms. They may need treatment, even if they test negative for gonorrhea.  SEEK MEDICAL CARE IF:   You develop any bad reaction to the medicine you were prescribed. This may include:   A rash.   Nausea.   Vomiting.   Diarrhea.   Your symptoms do not improve after a few days of taking antibiotics.   Your symptoms get worse.   You develop increased pain, such as in the testicles (for males) or in the abdomen (for females).  You have a fever. MAKE SURE YOU:   Understand these instructions.  Will watch your condition.  Will get help right away if you are not doing well or get worse.   This information is not intended to replace advice given to you by your health care provider. Make sure you discuss any questions you have with your health care provider.   Document Released: 10/20/2000 Document Revised: 11/13/2014 Document Reviewed: 04/30/2013 Elsevier Interactive Patient Education [employer].

## 2015-10-27 NOTE — Progress Notes (Signed)
   Subjective:    Patient ID: Chiffon Sellen, female    DOB: [birth year], 29 y.o.   MRN: [id number]  Chief Complaint  Patient presents with  . Follow-up    HPI:  Wrenna Groah is a 29 y.o. female who  has a past medical history of Environmental allergies; Heartburn; Asthma; Chicken pox; and Migraine. and presents today for a follow up office visit.  Recently seen in the office and tested positive for gonorrhea. Failed treatment with Azithromycin and continues to experience the associated symptoms of vaginal discharge and mild lower abdominal pain. Denies fevers.  No Known Allergies   Current Outpatient Prescriptions on File Prior to Visit  Medication Sig Dispense Refill  . ciprofloxacin (CIPRO) 250 MG tablet Take 1 tablet (250 mg total) by mouth 2 (two) times daily. 6 tablet 0  . phenazopyridine (PYRIDIUM) 100 MG tablet Take 1 tablet (100 mg total) by mouth 3 (three) times daily as needed for pain. 10 tablet 0  . Prenatal Multivit-Min-Fe-FA (PRE-NATAL FORMULA) TABS Taking daily 30 each 0   No current facility-administered medications on file prior to visit.    Review of Systems  Constitutional: Negative for fever and chills.  Gastrointestinal: Positive for abdominal pain.  Genitourinary: Positive for vaginal discharge.      Objective:    BP 120/78 mmHg  Pulse 85  Temp(Src) 98 F (36.7 C) (Oral)  Resp 18  Ht 5\' 1"  (1.549 m)  Wt 152 lb (68.947 kg)  BMI 28.74 kg/m2  SpO2 99% Nursing note and vital signs reviewed.  Physical Exam  Constitutional: She is oriented to person, place, and time. She appears well-developed and well-nourished. No distress.  Cardiovascular: Normal rate, regular rhythm, normal heart sounds and intact distal pulses.   Pulmonary/Chest: Effort normal and breath sounds normal.  Abdominal: Soft. Bowel sounds are normal. She exhibits no distension and no mass. There is no tenderness. There is no rebound and no guarding.  Neurological: She is alert and  oriented to person, place, and time.  Skin: Skin is warm and dry.  Psychiatric: She has a normal mood and affect. Her behavior is normal. Judgment and thought content normal.       Assessment & Plan:   Problem List Items Addressed This Visit      Other   Gonorrhea - Primary    Symptoms consistent with gonorrhea. In office injection of Rocephin given without complication. Start doxycycline. Follow up if symptoms worsen or fail to improve.       Relevant Medications   doxycycline (VIBRA-TABS) 100 MG tablet   cefTRIAXone (ROCEPHIN) injection 500 mg (Completed)

## 2015-11-07 NOTE — L&D Delivery Note (Signed)
Delivery Note  At 8:03 AM a viable female, named Victoria Yoder,  was delivered via Vaginal, Spontaneous Delivery (Presentation: ROA ).  APGAR: 9, 9; weight 4 lb 9 oz (2070 g).     Anesthesia:  Epidural Episiotomy: None Lacerations: Vaginal;1st degree Suture Repair: 3.0 vicryl Est. Blood Loss (mL):  200  45 MINUTES AFTER DELIVERY,PLACENTA REMAINS IN SITU WITH NO DETACHMENT. NO ACTIVE BLEEDING. PATIENT CONSENTED FOR MANUAL EXTRACTION OF PLACENTA AND D&c. REVIEWED WITH PATIENT AND HUSBAND POSSIBLE RISKS INCLUDING: NEED FOR TRANSFUSION, NEED FOR LAPAROTOMY AND NEED FOR HYSTERECTOMY. QUESTIONS ANSWERED.  Mom to OR.  Baby to Nursery.  Dequita Schleicher A 10/05/2016, 8:59 AM

## 2015-11-08 ENCOUNTER — Encounter: Payer: Self-pay | Admitting: Family

## 2015-11-23 ENCOUNTER — Telehealth: Payer: Self-pay | Admitting: Internal Medicine

## 2015-11-23 NOTE — Telephone Encounter (Signed)
Recommend follow up with Urgent Care as I am out of the office the rest of the week.

## 2015-11-23 NOTE — Telephone Encounter (Signed)
Patient called in to advise that she thinks that her gonorrhea is resurfacing. There is no availability with dr burns this week. Patient has an establishment appt with an OBGYN, but it is not until 12/01/2015. She is asking if there is anything that we can do at this point.

## 2015-11-23 NOTE — Telephone Encounter (Signed)
Please advise, Pt was seen for the reason in Dec with you.

## 2015-11-23 NOTE — Telephone Encounter (Signed)
If Marya Amsler can not see her - she needs to go to urgent care or call gyn

## 2015-11-24 NOTE — Telephone Encounter (Signed)
Pt was able to get appt with GYN moved up.

## 2016-01-25 ENCOUNTER — Encounter: Payer: Self-pay | Admitting: Family Medicine

## 2016-01-25 ENCOUNTER — Ambulatory Visit (INDEPENDENT_AMBULATORY_CARE_PROVIDER_SITE_OTHER): Payer: BC Managed Care – PPO | Admitting: Family Medicine

## 2016-01-25 VITALS — BP 116/87 | HR 109 | Temp 101.8°F | Resp 20 | Wt 152.5 lb

## 2016-01-25 DIAGNOSIS — J0100 Acute maxillary sinusitis, unspecified: Secondary | ICD-10-CM

## 2016-01-25 MED ORDER — AMOXICILLIN-POT CLAVULANATE 875-125 MG PO TABS: 1.0000 | ORAL_TABLET | Freq: Two times a day (BID) | ORAL | Status: DC

## 2016-01-25 NOTE — Progress Notes (Signed)
Patient ID: Victoria Yoder, female   DOB: February 29, 1986, 30 y.o.   MRN: [id number]    Victoria Yoder , 26-Jun-1986, 30 y.o., female MRN: [id number]  CC: Sinusitis Subjective: Pt presents for an acute OV with complaints of facial pain  of 4 days duration.  Associated symptoms include congestion, body aches, chills, fever, dry cough, bloody nose. Pt feels symptoms are worsening. Pt has tried tylenol cold/flu, benadryl, dayquil and cough drops to ease their symptoms.  Pt denies nausea, vomit, diarrhea, rash. UTD with flu and td  Childhood asthma, no adult issues.  No Known Allergies Social History  Substance Use Topics  . Smoking status: Never Smoker   . Smokeless tobacco: Not on file  . Alcohol Use: Yes     Comment: occasional   Past Medical History  Diagnosis Date  . Environmental allergies   . Heartburn   . Asthma   . Chicken pox   . Migraine    Past Surgical History  Procedure Laterality Date  . Unremarkable.     Family History  Problem Relation Age of Onset  . Diabetes Maternal Grandmother   . Stroke Maternal Grandmother   . Diabetes Paternal Grandmother   . Heart disease Paternal Grandmother   . Hypertension Paternal Grandmother   . Diabetes Father   . Arthritis Father      Medication List       This list is accurate as of: 01/25/16  3:18 PM.  Always use your most recent med list.               PRE-NATAL FORMULA Tabs  Taking daily       ROS: Negative, with the exception of above mentioned in HPI  Objective:  BP 116/87 mmHg  Pulse 109  Temp(Src) 101.8 F (38.8 C)  Resp 20  Wt 152 lb 8 oz (69.174 kg)  SpO2 96% Body mass index is 28.83 kg/(m^2). Gen: Afebrile. No acute distress. Nontoxic in appearance, well developed, well nourished. Female.pleasant.  HENT: AT. [place]. Bilateral TM, air fluid levels. No erythema.  MMM, no oral lesions. Bilateral nares with severe erythema and swelling. Throat without erythema or exudates. No cough. TTP facial sinus.    Eyes:Pupils Equal Round Reactive to light, Extraocular movements intact,  Conjunctiva without redness, discharge or icterus. Neck/lymp/endocrine: Supple, ant cervical  lymphadenopathy CV: tachycardic Chest: CTAB, no wheeze or crackles. Good air movement, normal resp effort.  Abd: Soft. NTND. BS present Skin: No rashes, purpura or petechiae.  Neuro: Normal gait. PERLA. EOMi. Alert. Oriented x3  Assessment/Plan: Victoria Yoder is a 30 y.o. female present for acute OV for  1. Acute maxillary sinusitis, recurrence not specified - amoxicillin-clavulanate (AUGMENTIN) 875-125 MG tablet; Take 1 tablet by mouth 2 (two) times daily.  Dispense: 20 tablet; Refill: 0 - Flonase, mucinex, Rest,  hydrate , advil/supportive therapy.  - Augmentin prescribed.  - F/U PRN  electronically signed by:  Howard Pouch, DO  [place]

## 2016-01-25 NOTE — Patient Instructions (Signed)
Sinusitis, Adult Sinusitis is redness, soreness, and inflammation of the paranasal sinuses. Paranasal sinuses are air pockets within the bones of your face. They are located beneath your eyes, in the middle of your forehead, and above your eyes. In healthy paranasal sinuses, mucus is able to drain out, and air is able to circulate through them by way of your nose. However, when your paranasal sinuses are inflamed, mucus and air can become trapped. This can allow bacteria and other germs to grow and cause infection. Sinusitis can develop quickly and last only a short time (acute) or continue over a long period (chronic). Sinusitis that lasts for more than 12 weeks is considered chronic. CAUSES Causes of sinusitis include:  Allergies.  Structural abnormalities, such as displacement of the cartilage that separates your nostrils (deviated septum), which can decrease the air flow through your nose and sinuses and affect sinus drainage.  Functional abnormalities, such as when the small hairs (cilia) that line your sinuses and help remove mucus do not work properly or are not present. SIGNS AND SYMPTOMS Symptoms of acute and chronic sinusitis are the same. The primary symptoms are pain and pressure around the affected sinuses. Other symptoms include:  Upper toothache.  Earache.  Headache.  Bad breath.  Decreased sense of smell and taste.  A cough, which worsens when you are lying flat.  Fatigue.  Fever.  Thick drainage from your nose, which often is green and may contain pus (purulent).  Swelling and warmth over the affected sinuses. DIAGNOSIS Your health care provider will perform a physical exam. During your exam, your health care provider may perform any of the following to help determine if you have acute sinusitis or chronic sinusitis:  Look in your nose for signs of abnormal growths in your nostrils (nasal polyps).  Tap over the affected sinus to check for signs of  infection.  View the inside of your sinuses using an imaging device that has a light attached (endoscope). If your health care provider suspects that you have chronic sinusitis, one or more of the following tests may be recommended:  Allergy tests.  Nasal culture. A sample of mucus is taken from your nose, sent to a lab, and screened for bacteria.  Nasal cytology. A sample of mucus is taken from your nose and examined by your health care provider to determine if your sinusitis is related to an allergy. TREATMENT Most cases of acute sinusitis are related to a viral infection and will resolve on their own within 10 days. Sometimes, medicines are prescribed to help relieve symptoms of both acute and chronic sinusitis. These may include pain medicines, decongestants, nasal steroid sprays, or saline sprays. However, for sinusitis related to a bacterial infection, your health care provider will prescribe antibiotic medicines. These are medicines that will help kill the bacteria causing the infection. Rarely, sinusitis is caused by a fungal infection. In these cases, your health care provider will prescribe antifungal medicine. For some cases of chronic sinusitis, surgery is needed. Generally, these are cases in which sinusitis recurs more than 3 times per year, despite other treatments. HOME CARE INSTRUCTIONS  Drink plenty of water. Water helps thin the mucus so your sinuses can drain more easily.  Use a humidifier.  Inhale steam 3-4 times a day (for example, sit in the bathroom with the shower running).  Apply a warm, moist washcloth to your face 3-4 times a day, or as directed by your health care provider.  Use saline nasal sprays to help   moisten and clean your sinuses.  Take medicines only as directed by your health care provider.  If you were prescribed either an antibiotic or antifungal medicine, finish it all even if you start to feel better. SEEK IMMEDIATE MEDICAL CARE IF:  You have  increasing pain or severe headaches.  You have nausea, vomiting, or drowsiness.  You have swelling around your face.  You have vision problems.  You have a stiff neck.  You have difficulty breathing.   This information is not intended to replace advice given to you by your health care provider. Make sure you discuss any questions you have with your health care provider.   Document Released: 10/23/2005 Document Revised: 11/13/2014 Document Reviewed: 11/07/2011 Elsevier Interactive Patient Education 2016 [place], mucinex, Rest,  hydrate , advil/supportive therapy.  augmentin prescribed.

## 2016-03-13 LAB — OB RESULTS CONSOLE ANTIBODY SCREEN: ANTIBODY SCREEN: NEGATIVE

## 2016-03-13 LAB — OB RESULTS CONSOLE HIV ANTIBODY (ROUTINE TESTING): HIV: NONREACTIVE

## 2016-03-13 LAB — OB RESULTS CONSOLE HEPATITIS B SURFACE ANTIGEN: Hepatitis B Surface Ag: NEGATIVE

## 2016-03-13 LAB — OB RESULTS CONSOLE GC/CHLAMYDIA
CHLAMYDIA, DNA PROBE: NEGATIVE
GC PROBE AMP, GENITAL: NEGATIVE

## 2016-03-13 LAB — OB RESULTS CONSOLE RUBELLA ANTIBODY, IGM: Rubella: IMMUNE

## 2016-03-13 LAB — OB RESULTS CONSOLE RPR: RPR: NONREACTIVE

## 2016-03-13 LAB — OB RESULTS CONSOLE ABO/RH: RH Type: POSITIVE

## 2016-06-09 ENCOUNTER — Other Ambulatory Visit (HOSPITAL_COMMUNITY): Payer: Self-pay | Admitting: Obstetrics and Gynecology

## 2016-06-09 DIAGNOSIS — Z3689 Encounter for other specified antenatal screening: Secondary | ICD-10-CM

## 2016-06-30 ENCOUNTER — Ambulatory Visit (HOSPITAL_COMMUNITY): Payer: BC Managed Care – PPO

## 2016-06-30 ENCOUNTER — Ambulatory Visit (HOSPITAL_COMMUNITY)
Admission: RE | Admit: 2016-06-30 | Discharge: 2016-06-30 | Disposition: A | Discharge status: Home / Self Care | Payer: BC Managed Care – PPO | Source: Ambulatory Visit | Attending: Obstetrics and Gynecology | Admitting: Obstetrics and Gynecology

## 2016-06-30 DIAGNOSIS — O26842 Uterine size-date discrepancy, second trimester: Secondary | ICD-10-CM | POA: Insufficient documentation

## 2016-06-30 DIAGNOSIS — D259 Leiomyoma of uterus, unspecified: Secondary | ICD-10-CM | POA: Insufficient documentation

## 2016-06-30 DIAGNOSIS — Z3A23 23 weeks gestation of pregnancy: Secondary | ICD-10-CM | POA: Insufficient documentation

## 2016-06-30 DIAGNOSIS — Z3689 Encounter for other specified antenatal screening: Secondary | ICD-10-CM

## 2016-06-30 DIAGNOSIS — Z36 Encounter for antenatal screening of mother: Secondary | ICD-10-CM | POA: Insufficient documentation

## 2016-06-30 DIAGNOSIS — O3412 Maternal care for benign tumor of corpus uteri, second trimester: Secondary | ICD-10-CM | POA: Insufficient documentation

## 2016-08-25 ENCOUNTER — Encounter (HOSPITAL_COMMUNITY): Payer: Self-pay

## 2016-08-25 ENCOUNTER — Inpatient Hospital Stay (HOSPITAL_COMMUNITY)
Admission: AD | Admit: 2016-08-25 | Discharge: 2016-08-25 | Disposition: A | Discharge status: Home / Self Care | Payer: BC Managed Care – PPO | Source: Ambulatory Visit | Attending: Obstetrics & Gynecology | Admitting: Obstetrics & Gynecology

## 2016-08-25 DIAGNOSIS — O4703 False labor before 37 completed weeks of gestation, third trimester: Secondary | ICD-10-CM

## 2016-08-25 DIAGNOSIS — Z3A31 31 weeks gestation of pregnancy: Secondary | ICD-10-CM | POA: Insufficient documentation

## 2016-08-25 DIAGNOSIS — O479 False labor, unspecified: Secondary | ICD-10-CM

## 2016-08-25 DIAGNOSIS — R102 Pelvic and perineal pain: Secondary | ICD-10-CM | POA: Diagnosis present

## 2016-08-25 LAB — URINE MICROSCOPIC-ADD ON
RBC / HPF: NONE SEEN RBC/hpf (ref 0–5)
WBC UA: NONE SEEN WBC/hpf (ref 0–5)

## 2016-08-25 LAB — URINALYSIS, ROUTINE W REFLEX MICROSCOPIC
Bilirubin Urine: NEGATIVE
Glucose, UA: NEGATIVE mg/dL
Hgb urine dipstick: NEGATIVE
Ketones, ur: NEGATIVE mg/dL
NITRITE: NEGATIVE
Protein, ur: NEGATIVE mg/dL
pH: 6.5 (ref 5.0–8.0)

## 2016-08-25 LAB — FETAL FIBRONECTIN: FETAL FIBRONECTIN: NEGATIVE

## 2016-08-25 MED ORDER — NIFEDIPINE 10 MG PO CAPS
10.0000 mg | ORAL_CAPSULE | ORAL | Status: DC | PRN
  Administered 2016-08-25 (×2): 10 mg via ORAL
  Filled 2016-08-25 (×2): qty 1

## 2016-08-25 MED ORDER — LACTATED RINGERS IV BOLUS (SEPSIS)
1000.0000 mL | Freq: Once | INTRAVENOUS | Status: AC
  Administered 2016-08-25: 1000 mL via INTRAVENOUS

## 2016-08-25 NOTE — MAU Note (Signed)
Patient presents with none stop Braxton Hicks contractions all day, back pain.

## 2016-08-25 NOTE — Discharge Instructions (Signed)
Preterm Labor Information Preterm labor is when labor starts at less than 37 weeks of pregnancy. The normal length of a pregnancy is 39 to 41 weeks. CAUSES Often, there is no identifiable underlying cause as to why a woman goes into preterm labor. One of the most common known causes of preterm labor is infection. Infections of the uterus, cervix, vagina, amniotic sac, bladder, kidney, or even the lungs (pneumonia) can cause labor to start. Other suspected causes of preterm labor include:   Urogenital infections, such as yeast infections and bacterial vaginosis.   Uterine abnormalities (uterine shape, uterine septum, fibroids, or bleeding from the placenta).   A cervix that has been operated on (it may fail to stay closed).   Malformations in the fetus.   Multiple gestations (twins, triplets, and so on).   Breakage of the amniotic sac.  RISK FACTORS  Having a previous history of preterm labor.   Having premature rupture of membranes (PROM).   Having a placenta that covers the opening of the cervix (placenta previa).   Having a placenta that separates from the uterus (placental abruption).   Having a cervix that is too weak to hold the fetus in the uterus (incompetent cervix).   Having too much fluid in the amniotic sac (polyhydramnios).   Taking illegal drugs or smoking while pregnant.   Not gaining enough weight while pregnant.   Being younger than 80 and older than 32 years old.   Having a low socioeconomic status.   Being African American. SYMPTOMS Signs and symptoms of preterm labor include:   Menstrual-like cramps, abdominal pain, or back pain.  Uterine contractions that are regular, as frequent as six in an hour, regardless of their intensity (may be mild or painful).  Contractions that start on the top of the uterus and spread down to the lower abdomen and back.   A sense of increased pelvic pressure.   A watery or bloody mucus discharge that  comes from the vagina.  TREATMENT Depending on the length of the pregnancy and other circumstances, your health care provider may suggest bed rest. If necessary, there are medicines that can be given to stop contractions and to mature the fetal lungs. If labor happens before 34 weeks of pregnancy, a prolonged hospital stay may be recommended. Treatment depends on the condition of both you and the fetus.  WHAT SHOULD YOU DO IF YOU THINK YOU ARE IN PRETERM LABOR? Call your health care provider right away. You will need to go to the hospital to get checked immediately. HOW CAN YOU PREVENT PRETERM LABOR IN FUTURE PREGNANCIES? You should:   Stop smoking if you smoke.  Maintain healthy weight gain and avoid chemicals and drugs that are not necessary.  Be watchful for any type of infection.  Inform your health care provider if you have a known history of preterm labor.   This information is not intended to replace advice given to you by your health care provider. Make sure you discuss any questions you have with your health care provider.   Document Released: 01/13/2004 Document Revised: 06/25/2013 Document Reviewed: 11/25/2012 Elsevier Interactive Patient Education 2016 Else[employer] Inc[employer]  Fetal Fibronectin Fetal fibronectin (fFN) is a protein that your body produces during pregnancy. This protein is normally found in your vaginal fluid in early pregnancy and just before delivery. It should not be there between 22 and 35 weeks of pregnancy. Having fFN in your vagina between 22 and 35 weeks could be a warning sign that your  baby will be born early (prematurely). Babies born prematurely, or before 67 weeks, may have trouble breathing or feeding. A negative fFN test between 22 and 35 weeks means that it is unlikely you will have a premature delivery in the next 2 weeks. You may have this test if you have symptoms of premature labor. These include:  Contractions.  Increased vaginal  discharge.  Backache. If there is a chance of preterm labor and delivery, your health care provider will monitor you carefully and take steps to delay your labor if necessary.  This test requires a sample of fluid from inside your vagina. Your health care provider collects this sample using a cotton swab.  PREPARATION FOR TEST   Ask your health care provider if:  You need to avoid using lubricants or douches before this exam.  You need to avoid sexual intercourse for 24 hours before the exam.  Tell your health care provider if you have a vaginal yeast infection or any symptoms of a yeast infection:  Itching.  Soreness.  Discharge. RESULTS It is your responsibility to obtain your test results. Ask the lab or department performing the test when and how you will get your results. Contact your health care provider to discuss any questions you have about your results.  The results of this test will be positive or negative.  Meaning of Negative Test Results A negative result means no fFN was found in your vaginal fluid. A negative result means that there is very little chance you will go into labor in the next two weeks. You may have this test again in two weeks if you are still having symptoms of early labor. Meaning of Positive Test Results A positive result means fFN was found in your vaginal fluid. A positive result does not mean you will go into early labor. It does mean your risk is greater. Your health care provider may do other tests and exams to closely follow your pregnancy.   This information is not intended to replace advice given to you by your health care provider. Make sure you discuss any questions you have with your health care provider.   Document Released: 08/24/2004 Document Revised: 11/13/2014 Document Reviewed: 01/20/2014 Elsevier Interactive Patient Education [employer].

## 2016-08-25 NOTE — MAU Provider Note (Signed)
Chief Complaint:  Back Pain and Contractions   First Provider Initiated Contact with Patient 08/25/16 1426      HPI: Victoria Yoder is a 30 y.o. G1P0 at 59w2dwho presents to maternity admissions reporting onset of constant pelvic pressure and intermittent uterine contractions early this morning. She reports 2-3 contractions/hour and some are mild but some are more uncomfortable.  She has tried drinking water and changing positions but it is not helping. She has never had this symptom before and it is staying the same over time since onset this morning.   She reports good fetal movement, denies LOF, vaginal bleeding, vaginal itching/burning, urinary symptoms, h/a, dizziness, n/v, or fever/chills.    HPI  Past Medical History: Past Medical History:  Diagnosis Date  . Asthma   . Chicken pox   . Environmental allergies   . Heartburn   . Migraine     Past obstetric history: OB History  Gravida Para Term Preterm AB Living  1            SAB TAB Ectopic Multiple Live Births               # Outcome Date GA Lbr Len/2nd Weight Sex Delivery Anes PTL Lv  1 Current               Past Surgical History: Past Surgical History:  Procedure Laterality Date  . Unremarkable.      Family History: Family History  Problem Relation Age of Onset  . Diabetes Maternal Grandmother   . Stroke Maternal Grandmother   . Diabetes Paternal Grandmother   . Heart disease Paternal Grandmother   . Hypertension Paternal Grandmother   . Diabetes Father   . Arthritis Father     Social History: Social History  Substance Use Topics  . Smoking status: Never Smoker  . Smokeless tobacco: Never Used  . Alcohol use Yes     Comment: occasional    Allergies: No Known Allergies  Meds:  Prescriptions Prior to Admission  Medication Sig Dispense Refill Last Dose  . ferrous sulfate 325 (65 FE) MG tablet Take 325 mg by mouth 2 (two) times daily.  1 08/25/2016 at Unknown time  . Prenatal Multivit-Min-Fe-FA  (PRE-NATAL FORMULA) TABS Taking daily (Patient taking differently: Take 1 tablet by mouth at bedtime. Taking daily) 30 each 0 08/24/2016 at Unknown time    ROS:  Review of Systems  Constitutional: Negative for chills, fatigue and fever.  Eyes: Negative for visual disturbance.  Respiratory: Negative for shortness of breath.   Cardiovascular: Negative for chest pain.  Gastrointestinal: Positive for abdominal pain. Negative for nausea and vomiting.  Genitourinary: Positive for pelvic pain. Negative for difficulty urinating, dysuria, flank pain, vaginal bleeding, vaginal discharge and vaginal pain.  Neurological: Negative for dizziness and headaches.  Psychiatric/Behavioral: Negative.      I have reviewed patient's Past Medical Hx, Surgical Hx, Family Hx, Social Hx, medications and allergies.   Physical Exam   Patient Vitals for the past 24 hrs:  BP Temp Temp src Pulse Resp Height Weight  08/25/16 1459 111/65 - - - - - -  08/25/16 1437 119/67 - - 82 - - -  08/25/16 1239 117/64 98.4 F (36.9 C) Oral 82 16 5\' 1"  (1.549 m) 165 lb 1.9 oz (74.9 kg)   Constitutional: Well-developed, well-nourished female in no acute distress.  Cardiovascular: normal rate Respiratory: normal effort GI: Abd soft, non-tender, gravid appropriate for gestational age.  MS: Extremities nontender, no edema, normal ROM  Neurologic: Alert and oriented x 4.  GU: Neg CVAT.  Cervix 1 cm external os but closed internally. 0/thick/high, posterior, firm     FHT:  Baseline 145 , moderate variability, accelerations present, no decelerations Contractions: 3-10 minutes, irregular   Labs: Results for orders placed or performed during the hospital encounter of 08/25/16 (from the past 24 hour(s))  Urinalysis, Routine w reflex microscopic (not at [facility])     Status: Abnormal   Collection Time: 08/25/16 12:43 PM  Result Value Ref Range   Color, Urine YELLOW YELLOW   APPearance CLEAR CLEAR   Specific Gravity, Urine <1.005  (L) 1.005 - 1.030   pH 6.5 5.0 - 8.0   Glucose, UA NEGATIVE NEGATIVE mg/dL   Hgb urine dipstick NEGATIVE NEGATIVE   Bilirubin Urine NEGATIVE NEGATIVE   Ketones, ur NEGATIVE NEGATIVE mg/dL   Protein, ur NEGATIVE NEGATIVE mg/dL   Nitrite NEGATIVE NEGATIVE   Leukocytes, UA TRACE (A) NEGATIVE  Urine microscopic-add on     Status: Abnormal   Collection Time: 08/25/16 12:43 PM  Result Value Ref Range   Squamous Epithelial / LPF 0-5 (A) NONE SEEN   WBC, UA NONE SEEN 0 - 5 WBC/hpf   RBC / HPF NONE SEEN 0 - 5 RBC/hpf   Bacteria, UA RARE (A) NONE SEEN  Fetal fibronectin     Status: None   Collection Time: 08/25/16  2:20 PM  Result Value Ref Range   Fetal Fibronectin NEGATIVE NEGATIVE      Imaging:  No results found.  MAU Course/MDM: I have ordered labs and reviewed results.  NST reviewed and reactive Consult [place] with presentation, exam findings and test results.  Treatments in MAU included LR x 1000 ml IV and Procardia 10 mg Q 20 min PRN x 2 with complete relief of pt symptoms.   Pt stable at time of discharge.  Today's evaluation included a work-up for preterm labor which can be life-threatening for both mom and baby.  Assessment: 1. Threatened preterm labor, third trimester   2. Braxton Hicks contractions     Plan: Discharge home Labor precautions and fetal kick counts Follow-up Information    Alinda Dooms, MD .   Specialty:  Obstetrics and Gynecology Why:  As scheduled, return to MAU as needed for emergencies Contact information: [address] [place] [address] [phone]            Medication List    TAKE these medications   ferrous sulfate 325 (65 FE) MG tablet Take 325 mg by mouth 2 (two) times daily.   PRE-NATAL FORMULA Tabs Taking daily What changed:  how much to take  how to take this  when to take this  additional instructions       Fatima Blank Certified Nurse-Midwife 08/25/2016 3:58 PM

## 2016-08-28 ENCOUNTER — Encounter: Payer: BC Managed Care – PPO | Attending: Obstetrics and Gynecology | Admitting: Skilled Nursing Facility1

## 2016-08-28 DIAGNOSIS — O24419 Gestational diabetes mellitus in pregnancy, unspecified control: Secondary | ICD-10-CM | POA: Diagnosis not present

## 2016-08-28 DIAGNOSIS — Z713 Dietary counseling and surveillance: Secondary | ICD-10-CM | POA: Diagnosis not present

## 2016-08-28 DIAGNOSIS — Z3A00 Weeks of gestation of pregnancy not specified: Secondary | ICD-10-CM | POA: Diagnosis not present

## 2016-08-28 DIAGNOSIS — O99810 Abnormal glucose complicating pregnancy: Secondary | ICD-10-CM

## 2016-08-29 ENCOUNTER — Encounter: Payer: Self-pay | Admitting: Skilled Nursing Facility1

## 2016-08-29 NOTE — Progress Notes (Signed)
  Patient was seen on 08/29/2016 for Gestational Diabetes self-management class at the [facility]. The following learning objectives were met by the patient during this course:   States the definition of Gestational Diabetes  States why dietary management is important in controlling blood glucose  Describes the effects each nutrient has on blood glucose levels  Demonstrates ability to create a balanced meal plan  Demonstrates carbohydrate counting   States when to check blood glucose levels involving a total of 4 separate occurences in a day  Demonstrates proper blood glucose monitoring techniques  States the effect of stress and exercise on blood glucose levels  States the importance of limiting caffeine and abstaining from alcohol and smoking  Demonstrates the knowledge the glucometer provided in class may not be covered by their insurance and to call their insurance provider immediately after class to know which glucometer their insurance provider does cover as well as calling their physician the next day for a prescription to the glucometer their insurance does cover (if the one provided is not) as well as the lancets and strips for that meter.  Blood glucose monitor given: contour one next Lot # [id number] Exp: 2017/11/05 Blood glucose reading: 78  Patient instructed to monitor glucose levels: FBS: 60 - <90 1 hour: <140 2 hour: <120  *Patient received handouts:  Nutrition Diabetes and Pregnancy  Carbohydrate Counting List  Patient will be seen for follow-up as needed.

## 2016-09-22 ENCOUNTER — Encounter (HOSPITAL_COMMUNITY): Payer: BC Managed Care – PPO

## 2016-09-22 ENCOUNTER — Ambulatory Visit (HOSPITAL_COMMUNITY): Admission: RE | Admit: 2016-09-22 | Payer: BC Managed Care – PPO | Source: Ambulatory Visit

## 2016-09-22 LAB — OB RESULTS CONSOLE GBS: STREP GROUP B AG: POSITIVE

## 2016-09-26 ENCOUNTER — Encounter (HOSPITAL_COMMUNITY): Payer: Self-pay | Admitting: *Deleted

## 2016-09-26 ENCOUNTER — Inpatient Hospital Stay (HOSPITAL_COMMUNITY)
Admission: AD | Admit: 2016-09-26 | Discharge: 2016-09-26 | Disposition: A | Discharge status: Home / Self Care | Payer: BC Managed Care – PPO | Source: Ambulatory Visit | Attending: Obstetrics and Gynecology | Admitting: Obstetrics and Gynecology

## 2016-09-26 ENCOUNTER — Inpatient Hospital Stay (HOSPITAL_COMMUNITY): Payer: BC Managed Care – PPO

## 2016-09-26 DIAGNOSIS — O365930 Maternal care for other known or suspected poor fetal growth, third trimester, not applicable or unspecified: Secondary | ICD-10-CM | POA: Diagnosis not present

## 2016-09-26 DIAGNOSIS — Z3A35 35 weeks gestation of pregnancy: Secondary | ICD-10-CM | POA: Diagnosis not present

## 2016-09-26 DIAGNOSIS — O368130 Decreased fetal movements, third trimester, not applicable or unspecified: Secondary | ICD-10-CM | POA: Diagnosis not present

## 2016-09-26 DIAGNOSIS — Z3689 Encounter for other specified antenatal screening: Secondary | ICD-10-CM

## 2016-09-26 NOTE — MAU Note (Signed)
PT  SAYS BABY  MOVING   LESS THAN  NL.  LAST TIME  AFTER MN- AFTER  JUICE.       SOMETIMES  FEELS  UC.     LAST SEX-       2 WEEKS   AGO.       LAST TIME  IN OFFICE LAST WEEK.  HAS AN APPOINTMENT   TODAY.

## 2016-09-26 NOTE — Discharge Instructions (Signed)
Introduction Patient Name: ________________________________________________ Patient Due Date: ____________________ What is a fetal movement count? A fetal movement count is the number of times that you feel your baby move during a certain amount of time. This may also be called a fetal kick count. A fetal movement count is recommended for every pregnant woman. You may be asked to start counting fetal movements as early as week 28 of your pregnancy. Pay attention to when your baby is most active. You may notice your baby's sleep and wake cycles. You may also notice things that make your baby move more. You should do a fetal movement count:  When your baby is normally most active.  At the same time each day. A good time to count movements is while you are resting, after having something to eat and drink. How do I count fetal movements? 1. Find a quiet, comfortable area. Sit, or lie down on your side. 2. Write down the date, the start time and stop time, and the number of movements that you felt between those two times. Take this information with you to your health care visits. 3. For 2 hours, count kicks, flutters, swishes, rolls, and jabs. You should feel at least 10 movements during 2 hours. 4. You may stop counting after you have felt 10 movements. 5. If you do not feel 10 movements in 2 hours, have something to eat and drink. Then, keep resting and counting for 1 hour. If you feel at least 4 movements during that hour, you may stop counting. Contact a health care provider if:  You feel fewer than 4 movements in 2 hours.  Your baby is not moving like he or she usually does. Date: ____________ Start time: ____________ Stop time: ____________ Movements: ____________ Date: ____________ Start time: ____________ Stop time: ____________ Movements: ____________ Date: ____________ Start time: ____________ Stop time: ____________ Movements: ____________ Date: ____________ Start time: ____________  Stop time: ____________ Movements: ____________ Date: ____________ Start time: ____________ Stop time: ____________ Movements: ____________ Date: ____________ Start time: ____________ Stop time: ____________ Movements: ____________ Date: ____________ Start time: ____________ Stop time: ____________ Movements: ____________ Date: ____________ Start time: ____________ Stop time: ____________ Movements: ____________ Date: ____________ Start time: ____________ Stop time: ____________ Movements: ____________ This information is not intended to replace advice given to you by your health care provider. Make sure you discuss any questions you have with your health care provider. Document Released: 11/22/2006 Document Revised: 06/21/2016 Document Reviewed: 12/02/2015 Elsevier Interactive Patient Education  2017 [employer]  

## 2016-09-26 NOTE — MAU Note (Signed)
Pt reports feeling less fetal movement today-felt baby kick in triage, but before last felt movement around midnight. Drank juice, but still did not feel much movement. Denies pain, LOF or vag bleeding. Pt baby IUGR and patient is scheduled for induction around 37 weeks. Has appointment today at [facility].

## 2016-09-26 NOTE — MAU Provider Note (Signed)
History     CSN: [id number]  Arrival date and time: 09/26/16 0145   First Provider Initiated Contact with Patient 09/26/16 [phone]      Chief Complaint  Patient presents with  . Decreased Fetal Movement   Victoria Yoder is a 30 y.o. G1P0 at [redacted]w[redacted]d who presents today with decreased fetal movement. She states that she did not feel the baby move as much all day yesterday. She had tried different things all day, and around 0000 she had felt some smaller movements. She denies any VB or LOF. She reports feeling some occasional contractions. She states that she gets a few per hour at this time. She has an appointment later today for BPP and NST in the office. She is followed for IUGR, and she is planning induction at 37 weeks.    Past Medical History:  Diagnosis Date  . Asthma   . Chicken pox   . Environmental allergies   . Heartburn   . Migraine     Past Surgical History:  Procedure Laterality Date  . Unremarkable.      Family History  Problem Relation Age of Onset  . Diabetes Maternal Grandmother   . Stroke Maternal Grandmother   . Diabetes Paternal Grandmother   . Heart disease Paternal Grandmother   . Hypertension Paternal Grandmother   . Diabetes Father   . Arthritis Father     Social History  Substance Use Topics  . Smoking status: Never Smoker  . Smokeless tobacco: Never Used  . Alcohol use Yes     Comment: occasional    Allergies: No Known Allergies  Prescriptions Prior to Admission  Medication Sig Dispense Refill Last Dose  . ferrous sulfate 325 (65 FE) MG tablet Take 325 mg by mouth 2 (two) times daily.  1 09/25/2016 at Unknown time  . Prenatal Multivit-Min-Fe-FA (PRE-NATAL FORMULA) TABS Taking daily (Patient taking differently: Take 1 tablet by mouth at bedtime. Taking daily) 30 each 0 09/25/2016 at Unknown time    Review of Systems  Constitutional: Negative for fever.  Gastrointestinal: Negative for abdominal pain, nausea and vomiting.  Genitourinary:  Negative for dysuria, frequency and urgency.   Physical Exam   Blood pressure 121/79, pulse 71, temperature 98.1 F (36.7 C), temperature source Oral, resp. rate 20, height 5\' 1"  (1.549 m), weight 170 lb 4 oz (77.2 kg), last menstrual period 01/19/2016.  Physical Exam  Nursing note and vitals reviewed. Constitutional: She is oriented to person, place, and time. She appears well-developed and well-nourished. No distress.  HENT:  Head: Normocephalic.  Cardiovascular: Normal rate.   Respiratory: Effort normal.  GI: Soft. There is no tenderness. There is no rebound.  Neurological: She is alert and oriented to person, place, and time.  Skin: Skin is warm and dry.  Psychiatric: She has a normal mood and affect.  BPP 8/8, AFI 53%tile   FHT; 135, moderate with 15x15 accels, no decels Toco: irregular UCs   MAU Course  Procedures  MDM 0222:She reports that she has been feeling movement since being on the monitor.  0300: D/W Dr. Landry Mellow, ok for DC home    Assessment and Plan   1. Decreased fetal movements in third trimester, single or unspecified fetus   2. NST (non-stress test) reactive    DC home 3rd Trimester precautions  PTL precautions  Fetal kick counts RX: none  Return to MAU as needed FU with OB as planned  [facility] Follow up.  Specialty:  Obstetrics and Gynecology Contact information: [address] [place] [phone]           Mathis Bud 09/26/2016, 2:20 AM

## 2016-09-27 ENCOUNTER — Other Ambulatory Visit (HOSPITAL_COMMUNITY): Payer: Self-pay | Admitting: Obstetrics and Gynecology

## 2016-09-27 DIAGNOSIS — O24410 Gestational diabetes mellitus in pregnancy, diet controlled: Secondary | ICD-10-CM

## 2016-09-27 DIAGNOSIS — Z3A37 37 weeks gestation of pregnancy: Secondary | ICD-10-CM

## 2016-09-27 DIAGNOSIS — O365930 Maternal care for other known or suspected poor fetal growth, third trimester, not applicable or unspecified: Secondary | ICD-10-CM

## 2016-10-03 ENCOUNTER — Other Ambulatory Visit (HOSPITAL_COMMUNITY): Payer: Self-pay | Admitting: Obstetrics and Gynecology

## 2016-10-03 ENCOUNTER — Ambulatory Visit (HOSPITAL_COMMUNITY): Payer: BC Managed Care – PPO

## 2016-10-03 ENCOUNTER — Encounter (HOSPITAL_COMMUNITY): Payer: BC Managed Care – PPO

## 2016-10-03 DIAGNOSIS — Z3689 Encounter for other specified antenatal screening: Secondary | ICD-10-CM

## 2016-10-05 ENCOUNTER — Inpatient Hospital Stay (HOSPITAL_COMMUNITY): Payer: BC Managed Care – PPO | Admitting: Anesthesiology

## 2016-10-05 ENCOUNTER — Encounter (HOSPITAL_COMMUNITY): Payer: Self-pay | Admitting: *Deleted

## 2016-10-05 ENCOUNTER — Encounter (HOSPITAL_COMMUNITY)
Admission: AD | Disposition: A | Discharge status: Home / Self Care | Payer: Self-pay | Source: Ambulatory Visit | Attending: Obstetrics and Gynecology

## 2016-10-05 ENCOUNTER — Inpatient Hospital Stay (HOSPITAL_COMMUNITY)
Admission: AD | Admit: 2016-10-05 | Discharge: 2016-10-07 | DRG: 767 | Disposition: A | Discharge status: Home / Self Care | Payer: BC Managed Care – PPO | Source: Ambulatory Visit | Attending: Obstetrics and Gynecology | Admitting: Obstetrics and Gynecology

## 2016-10-05 DIAGNOSIS — O3413 Maternal care for benign tumor of corpus uteri, third trimester: Secondary | ICD-10-CM | POA: Diagnosis present

## 2016-10-05 DIAGNOSIS — Z833 Family history of diabetes mellitus: Secondary | ICD-10-CM | POA: Diagnosis not present

## 2016-10-05 DIAGNOSIS — O3410 Maternal care for benign tumor of corpus uteri, unspecified trimester: Secondary | ICD-10-CM

## 2016-10-05 DIAGNOSIS — R51 Headache: Secondary | ICD-10-CM

## 2016-10-05 DIAGNOSIS — J45909 Unspecified asthma, uncomplicated: Secondary | ICD-10-CM | POA: Diagnosis present

## 2016-10-05 DIAGNOSIS — Z8249 Family history of ischemic heart disease and other diseases of the circulatory system: Secondary | ICD-10-CM

## 2016-10-05 DIAGNOSIS — O99824 Streptococcus B carrier state complicating childbirth: Secondary | ICD-10-CM | POA: Diagnosis present

## 2016-10-05 DIAGNOSIS — O24419 Gestational diabetes mellitus in pregnancy, unspecified control: Secondary | ICD-10-CM | POA: Diagnosis present

## 2016-10-05 DIAGNOSIS — B951 Streptococcus, group B, as the cause of diseases classified elsewhere: Secondary | ICD-10-CM | POA: Diagnosis present

## 2016-10-05 DIAGNOSIS — Z3493 Encounter for supervision of normal pregnancy, unspecified, third trimester: Secondary | ICD-10-CM | POA: Diagnosis present

## 2016-10-05 DIAGNOSIS — Z3A37 37 weeks gestation of pregnancy: Secondary | ICD-10-CM | POA: Diagnosis not present

## 2016-10-05 DIAGNOSIS — D250 Submucous leiomyoma of uterus: Secondary | ICD-10-CM | POA: Diagnosis present

## 2016-10-05 DIAGNOSIS — O4202 Full-term premature rupture of membranes, onset of labor within 24 hours of rupture: Secondary | ICD-10-CM | POA: Diagnosis present

## 2016-10-05 DIAGNOSIS — O24420 Gestational diabetes mellitus in childbirth, diet controlled: Secondary | ICD-10-CM | POA: Diagnosis present

## 2016-10-05 DIAGNOSIS — O365930 Maternal care for other known or suspected poor fetal growth, third trimester, not applicable or unspecified: Principal | ICD-10-CM | POA: Diagnosis present

## 2016-10-05 DIAGNOSIS — Z823 Family history of stroke: Secondary | ICD-10-CM

## 2016-10-05 DIAGNOSIS — O9952 Diseases of the respiratory system complicating childbirth: Secondary | ICD-10-CM | POA: Diagnosis present

## 2016-10-05 DIAGNOSIS — O365990 Maternal care for other known or suspected poor fetal growth, unspecified trimester, not applicable or unspecified: Secondary | ICD-10-CM | POA: Diagnosis present

## 2016-10-05 DIAGNOSIS — G8929 Other chronic pain: Secondary | ICD-10-CM

## 2016-10-05 DIAGNOSIS — Z8279 Family history of other congenital malformations, deformations and chromosomal abnormalities: Secondary | ICD-10-CM

## 2016-10-05 DIAGNOSIS — D259 Leiomyoma of uterus, unspecified: Secondary | ICD-10-CM | POA: Diagnosis present

## 2016-10-05 HISTORY — PX: DILATION AND CURETTAGE OF UTERUS: SHX78

## 2016-10-05 LAB — COMPREHENSIVE METABOLIC PANEL
ALT: 19 U/L (ref 14–54)
ANION GAP: 10 (ref 5–15)
AST: 33 U/L (ref 15–41)
Albumin: 3 g/dL — ABNORMAL LOW (ref 3.5–5.0)
Alkaline Phosphatase: 187 U/L — ABNORMAL HIGH (ref 38–126)
BILIRUBIN TOTAL: 0.8 mg/dL (ref 0.3–1.2)
BUN: 7 mg/dL (ref 6–20)
CHLORIDE: 101 mmol/L (ref 101–111)
CO2: 23 mmol/L (ref 22–32)
Calcium: 9 mg/dL (ref 8.9–10.3)
Creatinine, Ser: 0.55 mg/dL (ref 0.44–1.00)
GFR calc Af Amer: >60 mL/min (ref >60)
Glucose, Bld: 82 mg/dL (ref 65–99)
POTASSIUM: 3.7 mmol/L (ref 3.5–5.1)
Sodium: 134 mmol/L — ABNORMAL LOW (ref 135–145)
TOTAL PROTEIN: 6 g/dL — AB (ref 6.5–8.1)

## 2016-10-05 LAB — RAPID HIV SCREEN (HIV 1/2 AB+AG)
HIV 1/2 Antibodies: NONREACTIVE
HIV-1 P24 ANTIGEN - HIV24: NONREACTIVE

## 2016-10-05 LAB — CBC
HEMATOCRIT: 38.9 % (ref 36.0–46.0)
HEMOGLOBIN: 14.1 g/dL (ref 12.0–15.0)
MCH: 31.8 pg (ref 26.0–34.0)
MCHC: 36.2 g/dL — ABNORMAL HIGH (ref 30.0–36.0)
MCV: 87.6 fL (ref 78.0–100.0)
Platelets: 265 10*3/uL (ref 150–400)
RBC: 4.44 MIL/uL (ref 3.87–5.11)
RDW: 14.2 % (ref 11.5–15.5)
WBC: 14.8 10*3/uL — AB (ref 4.0–10.5)

## 2016-10-05 LAB — CBC WITH DIFFERENTIAL/PLATELET
BASOS ABS: 0 10*3/uL (ref 0.0–0.1)
BASOS PCT: 0 %
EOS PCT: 0 %
Eosinophils Absolute: 0 10*3/uL (ref 0.0–0.7)
HCT: 37.3 % (ref 36.0–46.0)
Hemoglobin: 13.4 g/dL (ref 12.0–15.0)
LYMPHS PCT: 12 %
Lymphs Abs: 1.9 10*3/uL (ref 0.7–4.0)
MCH: 31.5 pg (ref 26.0–34.0)
MCHC: 35.9 g/dL (ref 30.0–36.0)
MCV: 87.6 fL (ref 78.0–100.0)
MONO ABS: 0.8 10*3/uL (ref 0.1–1.0)
MONOS PCT: 5 %
Neutro Abs: 13.4 10*3/uL — ABNORMAL HIGH (ref 1.7–7.7)
Neutrophils Relative %: 83 %
PLATELETS: 227 10*3/uL (ref 150–400)
RBC: 4.26 MIL/uL (ref 3.87–5.11)
RDW: 14.2 % (ref 11.5–15.5)
WBC: 16.2 10*3/uL — ABNORMAL HIGH (ref 4.0–10.5)

## 2016-10-05 LAB — PREPARE RBC (CROSSMATCH)

## 2016-10-05 LAB — LACTATE DEHYDROGENASE: LDH: 310 U/L — AB (ref 98–192)

## 2016-10-05 LAB — URIC ACID: URIC ACID, SERUM: 5.9 mg/dL (ref 2.3–6.6)

## 2016-10-05 LAB — PROTEIN / CREATININE RATIO, URINE
CREATININE, URINE: 114 mg/dL
PROTEIN CREATININE RATIO: 0.17 mg/mg{creat} — AB (ref 0.00–0.15)
TOTAL PROTEIN, URINE: 19 mg/dL

## 2016-10-05 LAB — RPR: RPR: NONREACTIVE

## 2016-10-05 LAB — POCT FERN TEST: POCT Fern Test: POSITIVE

## 2016-10-05 LAB — ABO/RH: ABO/RH(D): O POS

## 2016-10-05 SURGERY — DILATION AND CURETTAGE
Anesthesia: Epidural | Wound class: Clean Contaminated

## 2016-10-05 MED ORDER — OXYTOCIN 40 UNITS IN LACTATED RINGERS INFUSION - SIMPLE MED
2.5000 [IU]/h | INTRAVENOUS | Status: DC
  Administered 2016-10-05: 2.5 [IU]/h via INTRAVENOUS
  Filled 2016-10-05: qty 1000

## 2016-10-05 MED ORDER — FERROUS SULFATE 325 (65 FE) MG PO TABS
325.0000 mg | ORAL_TABLET | Freq: Two times a day (BID) | ORAL | Status: DC
  Administered 2016-10-05 – 2016-10-07 (×4): 325 mg via ORAL
  Filled 2016-10-05 (×4): qty 1

## 2016-10-05 MED ORDER — LACTATED RINGERS IV SOLN: 500.0000 mL | INTRAVENOUS | Status: DC | PRN

## 2016-10-05 MED ORDER — HYDRALAZINE HCL 20 MG/ML IJ SOLN: 10.0000 mg | Freq: Once | INTRAMUSCULAR | Status: DC | PRN

## 2016-10-05 MED ORDER — ZOLPIDEM TARTRATE 5 MG PO TABS: 5.0000 mg | ORAL_TABLET | Freq: Every evening | ORAL | Status: DC | PRN

## 2016-10-05 MED ORDER — COCONUT OIL OIL: 1.0000 "application " | TOPICAL_OIL | Status: DC | PRN

## 2016-10-05 MED ORDER — IBUPROFEN 600 MG PO TABS
600.0000 mg | ORAL_TABLET | Freq: Four times a day (QID) | ORAL | Status: DC
  Administered 2016-10-05 – 2016-10-07 (×9): 600 mg via ORAL
  Filled 2016-10-05 (×9): qty 1

## 2016-10-05 MED ORDER — MEASLES, MUMPS & RUBELLA VAC ~~LOC~~ INJ: 0.5000 mL | INJECTION | Freq: Once | SUBCUTANEOUS | Status: DC

## 2016-10-05 MED ORDER — OXYTOCIN 40 UNITS IN LACTATED RINGERS INFUSION - SIMPLE MED
INTRAVENOUS | Status: AC
  Filled 2016-10-05: qty 1000

## 2016-10-05 MED ORDER — TETANUS-DIPHTH-ACELL PERTUSSIS 5-2.5-18.5 LF-MCG/0.5 IM SUSP: 0.5000 mL | Freq: Once | INTRAMUSCULAR | Status: DC

## 2016-10-05 MED ORDER — FENTANYL 2.5 MCG/ML BUPIVACAINE 1/10 % EPIDURAL INFUSION (WH - ANES)
INTRAMUSCULAR | Status: DC | PRN
  Administered 2016-10-05: 14 mL/h via EPIDURAL

## 2016-10-05 MED ORDER — ONDANSETRON HCL 4 MG/2ML IJ SOLN: 4.0000 mg | INTRAMUSCULAR | Status: DC | PRN

## 2016-10-05 MED ORDER — ONDANSETRON HCL 4 MG/2ML IJ SOLN: 4.0000 mg | Freq: Four times a day (QID) | INTRAMUSCULAR | Status: DC | PRN

## 2016-10-05 MED ORDER — SODIUM CHLORIDE 0.9 % IV SOLN
2.0000 g | Freq: Once | INTRAVENOUS | Status: AC
  Administered 2016-10-05: 2 g via INTRAVENOUS
  Filled 2016-10-05: qty 2000

## 2016-10-05 MED ORDER — SENNOSIDES-DOCUSATE SODIUM 8.6-50 MG PO TABS
2.0000 | ORAL_TABLET | ORAL | Status: DC
  Administered 2016-10-05 – 2016-10-06 (×2): 2 via ORAL
  Filled 2016-10-05 (×2): qty 2

## 2016-10-05 MED ORDER — MEPERIDINE HCL 25 MG/ML IJ SOLN
INTRAMUSCULAR | Status: AC
  Filled 2016-10-05: qty 1

## 2016-10-05 MED ORDER — SODIUM CHLORIDE 0.9 % IV SOLN
3.0000 g | Freq: Four times a day (QID) | INTRAVENOUS | Status: AC
  Administered 2016-10-05 – 2016-10-06 (×4): 3 g via INTRAVENOUS
  Filled 2016-10-05 (×5): qty 3

## 2016-10-05 MED ORDER — PRENATAL MULTIVITAMIN CH
1.0000 | ORAL_TABLET | Freq: Every day | ORAL | Status: DC
  Administered 2016-10-06 – 2016-10-07 (×2): 1 via ORAL
  Filled 2016-10-05 (×2): qty 1

## 2016-10-05 MED ORDER — MISOPROSTOL 200 MCG PO TABS
1000.0000 ug | ORAL_TABLET | Freq: Once | ORAL | Status: AC
  Administered 2016-10-05: 1000 ug via RECTAL

## 2016-10-05 MED ORDER — LIDOCAINE-EPINEPHRINE 1 %-1:100000 IJ SOLN
INTRAMUSCULAR | Status: AC
  Filled 2016-10-05: qty 1

## 2016-10-05 MED ORDER — ONDANSETRON HCL 4 MG PO TABS: 4.0000 mg | ORAL_TABLET | ORAL | Status: DC | PRN

## 2016-10-05 MED ORDER — ACETAMINOPHEN 325 MG PO TABS: 650.0000 mg | ORAL_TABLET | ORAL | Status: DC | PRN

## 2016-10-05 MED ORDER — BENZOCAINE-MENTHOL 20-0.5 % EX AERO: 1.0000 "application " | INHALATION_SPRAY | CUTANEOUS | Status: DC | PRN

## 2016-10-05 MED ORDER — MISOPROSTOL 200 MCG PO TABS
ORAL_TABLET | ORAL | Status: AC
  Filled 2016-10-05: qty 5

## 2016-10-05 MED ORDER — LACTATED RINGERS IV SOLN
INTRAVENOUS | Status: DC | PRN
  Administered 2016-10-05: 08:00:00 via INTRAVENOUS

## 2016-10-05 MED ORDER — OXYCODONE-ACETAMINOPHEN 5-325 MG PO TABS
1.0000 | ORAL_TABLET | ORAL | Status: DC | PRN
  Administered 2016-10-06 – 2016-10-07 (×2): 1 via ORAL
  Filled 2016-10-05 (×2): qty 1

## 2016-10-05 MED ORDER — LACTATED RINGERS IV SOLN
INTRAVENOUS | Status: DC
  Administered 2016-10-05: 06:00:00 via INTRAVENOUS

## 2016-10-05 MED ORDER — DIBUCAINE 1 % RE OINT: 1.0000 "application " | TOPICAL_OINTMENT | RECTAL | Status: DC | PRN

## 2016-10-05 MED ORDER — MEPERIDINE HCL 25 MG/ML IJ SOLN: 12.5000 mg | INTRAMUSCULAR | Status: DC | PRN

## 2016-10-05 MED ORDER — WITCH HAZEL-GLYCERIN EX PADS: 1.0000 "application " | MEDICATED_PAD | CUTANEOUS | Status: DC | PRN

## 2016-10-05 MED ORDER — LIDOCAINE HCL (PF) 1 % IJ SOLN
INTRAMUSCULAR | Status: DC | PRN
  Administered 2016-10-05 (×2): 7 mL via EPIDURAL

## 2016-10-05 MED ORDER — SIMETHICONE 80 MG PO CHEW: 80.0000 mg | CHEWABLE_TABLET | ORAL | Status: DC | PRN

## 2016-10-05 MED ORDER — LABETALOL HCL 5 MG/ML IV SOLN: 20.0000 mg | INTRAVENOUS | Status: DC | PRN

## 2016-10-05 MED ORDER — FENTANYL 2.5 MCG/ML BUPIVACAINE 1/10 % EPIDURAL INFUSION (WH - ANES)
INTRAMUSCULAR | Status: AC
  Filled 2016-10-05: qty 100

## 2016-10-05 MED ORDER — SOD CITRATE-CITRIC ACID 500-334 MG/5ML PO SOLN
30.0000 mL | ORAL | Status: DC | PRN
  Administered 2016-10-05: 30 mL via ORAL
  Filled 2016-10-05: qty 15

## 2016-10-05 MED ORDER — LIDOCAINE HCL (PF) 1 % IJ SOLN
30.0000 mL | INTRAMUSCULAR | Status: DC | PRN
  Filled 2016-10-05: qty 30

## 2016-10-05 MED ORDER — DIPHENHYDRAMINE HCL 25 MG PO CAPS: 25.0000 mg | ORAL_CAPSULE | Freq: Four times a day (QID) | ORAL | Status: DC | PRN

## 2016-10-05 MED ORDER — OXYTOCIN 10 UNIT/ML IJ SOLN: INTRAMUSCULAR | Status: DC | PRN

## 2016-10-05 MED ORDER — FENTANYL CITRATE (PF) 100 MCG/2ML IJ SOLN
50.0000 ug | INTRAMUSCULAR | Status: DC | PRN
  Administered 2016-10-05: 50 ug via INTRAVENOUS
  Filled 2016-10-05: qty 2

## 2016-10-05 MED ORDER — OXYTOCIN BOLUS FROM INFUSION
500.0000 mL | Freq: Once | INTRAVENOUS | Status: AC
  Administered 2016-10-05: 500 mL via INTRAVENOUS

## 2016-10-05 MED ORDER — PHENYLEPHRINE 40 MCG/ML (10ML) SYRINGE FOR IV PUSH (FOR BLOOD PRESSURE SUPPORT)
PREFILLED_SYRINGE | INTRAVENOUS | Status: AC
  Administered 2016-10-05: 80 ug
  Filled 2016-10-05: qty 10

## 2016-10-05 MED ORDER — OXYCODONE-ACETAMINOPHEN 5-325 MG PO TABS: 2.0000 | ORAL_TABLET | ORAL | Status: DC | PRN

## 2016-10-05 SURGICAL SUPPLY — 14 items
CATH ROBINSON RED A/P 16FR (CATHETERS) ×3 IMPLANT
CLOTH BEACON ORANGE TIMEOUT ST (SAFETY) ×3 IMPLANT
CONTAINER PREFILL 10% NBF 60ML (FORM) ×6 IMPLANT
DECANTER SPIKE VIAL GLASS SM (MISCELLANEOUS) ×3 IMPLANT
GLOVE BIOGEL PI IND STRL 7.0 (GLOVE) ×2 IMPLANT
GLOVE BIOGEL PI INDICATOR 7.0 (GLOVE) ×4
GOWN STRL REUS W/TWL LRG LVL3 (GOWN DISPOSABLE) ×9 IMPLANT
PACK VAGINAL MINOR WOMEN LF (CUSTOM PROCEDURE TRAY) ×3 IMPLANT
PAD OB MATERNITY 4.3X12.25 (PERSONAL CARE ITEMS) ×3 IMPLANT
PAD PREP 24X48 CUFFED NSTRL (MISCELLANEOUS) ×3 IMPLANT
SUT VIC AB 3-0 CT1 27 (SUTURE) ×3
SUT VIC AB 3-0 CT1 TAPERPNT 27 (SUTURE) IMPLANT
TOWEL OR 17X24 6PK STRL BLUE (TOWEL DISPOSABLE) ×6 IMPLANT
WATER STERILE IRR 1000ML POUR (IV SOLUTION) ×3 IMPLANT

## 2016-10-05 NOTE — H&P (Signed)
Victoria Yoder is a 30 y.o. female, G1P0 at 37.1 weeks, presenting for labor.  Patient reports contractions starting at 0200 with SROM at 0230.  Pregnancy history remarkable as listed below.    Patient Active Problem List   Diagnosis Date Noted  . Indication for care or intervention in labor or delivery 10/05/2016  . Uterine fibroids affecting pregnancy 10/05/2016  . Group beta Strep positive 10/05/2016  . Family history of Marfan syndrome 10/05/2016  . Pregnancy affected by intrauterine growth retardation (IUGR) 10/05/2016  . Asthma 10/05/2016  . Gestational diabetes mellitus 10/05/2016    History of present pregnancy: Patient entered care at 13.6 weeks.   EDC of 10/25/2016 was established by Definite LMP of 01/19/2016.   Anatomy scan:  20.1 weeks, with normal findings and an posterior placenta.    Significant prenatal events: 1st Trimester: 2nd Trimester: C/O cluster headaches. US reveals 6 fibroids. Offered genetic testing due to husband with Marfan syndrome features. MFM consult for uterine fibroids-recommend repeat US at 38weeks for vaginal vs cesarean delivery.  3rd Trimester: Failed 3hr GTT.  AC lag noted at 28wks. Lagging BPD, HC, and AC at 32wks. Fetal pylectasis noted on 37wk [place]  Last evaluation:  10/02/2016 in office by Dr. Gillermo Murdoch.  BP 102/64,. Wt:  172, TWG: 20lbs  OB History    Gravida Para Term Preterm AB Living   1             SAB TAB Ectopic Multiple Live Births                 Past Medical History:  Diagnosis Date  . Asthma   . Chicken pox   . Environmental allergies   . Heartburn   . Migraine    Past Surgical History:  Procedure Laterality Date  . Unremarkable.     Family History: family history includes Arthritis in her father; Diabetes in her father, maternal grandmother, and paternal grandmother; Heart disease in her paternal grandmother; Hypertension in her paternal grandmother; Stroke in her maternal grandmother. Social History:  reports that she has  never smoked. She has never used smokeless tobacco. She reports that she drinks alcohol. She reports that she does not use drugs.   Prenatal Transfer Tool  Maternal Diabetes: Yes:  Diabetes Type:  Diet controlled Genetic Screening: Normal Maternal Ultrasounds/Referrals: Abnormal:  Findings:   IUGR and fibroids Fetal Ultrasounds or other Referrals:  Referred to Materal Fetal Medicine  Maternal Substance Abuse:  No Significant Maternal Medications:  None Significant Maternal Lab Results: Lab values include: Group B Strep positive    ROS:  +LOF, +Bloody Show, +Ctx, +FM  No Known Allergies     Blood pressure 128/85, pulse 67, temperature 98.2 F (36.8 C), resp. rate 20, last menstrual period 01/19/2016.  Physical Exam  Constitutional: She is oriented to person, place, and time. She appears well-developed and well-nourished.  HENT:  Head: Normocephalic and atraumatic.  Eyes: Conjunctivae are normal.  Neck: Normal range of motion.  Cardiovascular: Normal rate, regular rhythm and normal heart sounds.   Respiratory: Effort normal and breath sounds normal.  GI: Soft. Bowel sounds are normal.  Musculoskeletal: Normal range of motion. She exhibits no edema.  Neurological: She is alert and oriented to person, place, and time.  Skin: Skin is warm and dry.    Leopolds: EFW: Unable to assess d/t patient discomfort Presentation: Vertex by Exam  FHR: 135 bpm, Mod Var, -Decels, +Accels UCs:  Q2-69min, palpates moderate to strong  Prenatal labs: ABO, Rh:  O Positve Antibody:  Negative Rubella:  Immune RPR:   NR HBsAg:   Negative HIV:   NR GBS:  Positve Sickle cell/Hgb electrophoresis:  Normal Pap:  Uknown GC:  ND Chlamydia:  ND Other:  3Hr-Failed    Assessment IUP at 37.1wks Cat I FT Active Labor IUGR GBS Positive Uterine Fibroids GDM-A1  Plan: Admit to [facility]  Routine Labor and Delivery Orders per [facility] Protocol Start PCN for GBS prophylaxis Will wait in  MAU until bed available Okay for IV pain medication until able to get epidural-if desired Dr.EV to be updated as appropriate  Loann Quill, MSN 10/05/2016, 4:21 AM  Addendum [phone] Nurse call reports patient now on unit and requesting IV pain medications VE: 9.5/100/+1 Patient without IV placement in MAU and PCN not received Okay for Amp 2 grams Okay for Fentanyl  Await labs and offer epidural, if possible  Maryann Conners MSN, CNM  10/05/2016 6:21 AM

## 2016-10-05 NOTE — Anesthesia Preprocedure Evaluation (Signed)
Anesthesia Evaluation  Patient identified by MRN, date of birth, ID band Patient awake    Reviewed: Allergy & Precautions, H&P , NPO status , Patient's Chart, lab work & pertinent test results  Airway Mallampati: I  TM Distance: >3 FB Neck ROM: full    Dental no notable dental hx. (+) Dental Advisory Given   Pulmonary    Pulmonary exam normal        Cardiovascular negative cardio ROS Normal cardiovascular exam     Neuro/Psych negative psych ROS   GI/Hepatic negative GI ROS, Neg liver ROS,   Endo/Other    Renal/GU negative Renal ROS     Musculoskeletal   Abdominal (+) + obese,   Peds  Hematology negative hematology ROS (+)   Anesthesia Other Findings   Reproductive/Obstetrics                             Anesthesia Physical  Anesthesia Plan  ASA: II and emergent  Anesthesia Plan: Epidural and Regional   Post-op Pain Management:    Induction: Intravenous  Airway Management Planned: Simple Face Mask  Additional Equipment:   Intra-op Plan:   Post-operative Plan:   Informed Consent: I have reviewed the patients History and Physical, chart, labs and discussed the procedure including the risks, benefits and alternatives for the proposed anesthesia with the patient or authorized representative who has indicated his/her understanding and acceptance.   Dental advisory given  Plan Discussed with: CRNA and Anesthesiologist  Anesthesia Plan Comments:         Anesthesia Quick Evaluation

## 2016-10-05 NOTE — Anesthesia Preprocedure Evaluation (Signed)
Anesthesia Evaluation  Patient identified by MRN, date of birth, ID band Patient awake    Reviewed: Allergy & Precautions, H&P , NPO status , Patient's Chart, lab work & pertinent test results  Airway Mallampati: I  TM Distance: >3 FB Neck ROM: full    Dental no notable dental hx.    Pulmonary    Pulmonary exam normal        Cardiovascular negative cardio ROS Normal cardiovascular exam     Neuro/Psych negative psych ROS   GI/Hepatic negative GI ROS, Neg liver ROS,   Endo/Other    Renal/GU negative Renal ROS     Musculoskeletal   Abdominal (+) + obese,   Peds  Hematology negative hematology ROS (+)   Anesthesia Other Findings   Reproductive/Obstetrics (+) Pregnancy                             Anesthesia Physical Anesthesia Plan  ASA: II  Anesthesia Plan: Epidural   Post-op Pain Management:    Induction:   Airway Management Planned:   Additional Equipment:   Intra-op Plan:   Post-operative Plan:   Informed Consent: I have reviewed the patients History and Physical, chart, labs and discussed the procedure including the risks, benefits and alternatives for the proposed anesthesia with the patient or authorized representative who has indicated his/her understanding and acceptance.     Plan Discussed with:   Anesthesia Plan Comments:         Anesthesia Quick Evaluation

## 2016-10-05 NOTE — Anesthesia Postprocedure Evaluation (Signed)
Anesthesia Post Note  Patient: Victoria Yoder  Procedure(s) Performed: Procedure(s) (LRB): DILATATION AND CURETTAGE (N/A)  Patient location during evaluation: Mother Baby Anesthesia Type: Epidural Level of consciousness: awake, awake and alert, oriented and patient cooperative Pain management: pain level controlled Vital Signs Assessment: post-procedure vital signs reviewed and stable Respiratory status: spontaneous breathing, respiratory function stable and nonlabored ventilation Cardiovascular status: stable Postop Assessment: no headache, patient able to bend at knees, no signs of nausea or vomiting and no backache Anesthetic complications: no     Last Vitals:  Vitals:   10/05/16 1315 10/05/16 1440  BP: (!) 147/76 (!) 124/58  Pulse: 67 72  Resp: 17 18  Temp:  (!) 38.2 C    Last Pain:  Vitals:   10/05/16 1440  TempSrc: Oral  PainSc: 0-No pain   Pain Goal:                 Victoria Yoder L

## 2016-10-05 NOTE — Progress Notes (Signed)
Placenta still not delivered.  Pt prepared for OR

## 2016-10-05 NOTE — Transfer of Care (Signed)
Immediate Anesthesia Transfer of Care Note  Patient: Victoria Yoder  Procedure(s) Performed: Procedure(s): DILATATION AND CURETTAGE (N/A)  Patient Location: PACU  Anesthesia Type:Epidural  Level of Consciousness: awake, alert  and oriented  Airway & Oxygen Therapy: Patient Spontanous Breathing  Post-op Assessment: Report given to RN and Post -op Vital signs reviewed and stable  Post vital signs: Reviewed and stable  Last Vitals:  Vitals:   10/05/16 0851 10/05/16 0853  BP: 126/69   Pulse: 88   Resp:  18  Temp:      Last Pain:  Vitals:   10/05/16 0816  TempSrc:   PainSc: 0-No pain         Complications: No apparent anesthesia complications

## 2016-10-05 NOTE — Anesthesia Postprocedure Evaluation (Signed)
Anesthesia Post Note  Patient: Social worker  Procedure(s) Performed: Procedure(s) (LRB): DILATATION AND CURETTAGE (N/A)  Patient location during evaluation: PACU Anesthesia Type: MAC and Epidural Level of consciousness: awake and alert Pain management: pain level controlled Vital Signs Assessment: post-procedure vital signs reviewed and stable Respiratory status: spontaneous breathing and respiratory function stable Cardiovascular status: stable Anesthetic complications: no     Last Vitals:  Vitals:   10/05/16 1030 10/05/16 1043  BP:    Pulse:    Resp: 18 18  Temp:      Last Pain:  Vitals:   10/05/16 1017  TempSrc:   PainSc: 0-No pain   Pain Goal:                 Victoria Yoder

## 2016-10-05 NOTE — Lactation Note (Signed)
This note was copied from a baby's chart. Lactation Consultation Note  Patient Name: Victoria Yoder [id number] Date: 10/05/2016 Reason for consult: Initial assessment Baby at 9 hr of life. Mom stated it was time to latch baby, baby was laying sts not cueing. Baby is small and had a hard time opening mouth wide enough to get mom's nipple in her mouth. Mom has short shaft nipples with compressible breast. Baby was mostly licking and mouthing the breast. After 15 minutes mom swaddled baby and pumped. Lactation was called to another room. Lactation returned to room to check on pumping. Mom stated she only got drops and was trying to bottle feed 15 ml of formula. Baby was holding the bottle nipple in her mouth and sleeping. Demonstrated waking technique, laid baby on her side, and showed mom how to move the nipple around in baby's mouth. Baby was not interested in eating. After 30 minutes baby had only taken 67ml. Mom will offer the breast, post pump, and supplement per LPT infant policy. Reminded mom that feeding should not take over 30 minutes total. Parents are aware of lactation services and support group. They will call as needed.    Maternal Data Has patient been taught Hand Expression?: Yes Does the patient have breastfeeding experience prior to this delivery?: No  Feeding Feeding Type: Breast Fed Length of feed: 2 min  LATCH Score/Interventions Latch: Repeated attempts needed to sustain latch, nipple held in mouth throughout feeding, stimulation needed to elicit sucking reflex. Intervention(s): Adjust position;Assist with latch;Breast compression  Audible Swallowing: None Intervention(s): Hand expression;Skin to skin  Type of Nipple: Everted at rest and after stimulation  Comfort (Breast/Nipple): Soft / non-tender     Hold (Positioning): Assistance needed to correctly position infant at breast and maintain latch. Intervention(s): Position options;Support Pillows  LATCH Score:  6  Lactation Tools Discussed/Used Pump Review: Setup, frequency, and cleaning   Consult Status Consult Status: Follow-up Date: 10/06/16 Follow-up type: In-patient    Denzil Hughes 10/05/2016, 7:59 PM

## 2016-10-05 NOTE — Anesthesia Procedure Notes (Signed)
Epidural Patient location during procedure: OB Start time: 10/05/2016 6:58 AM End time: 10/05/2016 7:02 AM  Staffing Anesthesiologist: Lyn Hollingshead Performed: anesthesiologist   Preanesthetic Checklist Completed: patient identified, surgical consent, pre-op evaluation, timeout performed, IV checked, risks and benefits discussed and monitors and equipment checked  Epidural Patient position: sitting Prep: site prepped and draped and DuraPrep Patient monitoring: continuous pulse ox and blood pressure Approach: midline Location: L3-L4 Injection technique: LOR air  Needle:  Needle type: Tuohy  Needle gauge: 17 G Needle length: 9 cm and 9 Needle insertion depth: 7 cm Catheter type: closed end flexible Catheter size: 19 Gauge Catheter at skin depth: 12 cm Test dose: negative and Other  Assessment Sensory level: T9 Events: blood not aspirated, injection not painful, no injection resistance, negative IV test and no paresthesia  Additional Notes Reason for block:procedure for pain

## 2016-10-05 NOTE — Op Note (Signed)
Preoperative diagnosis: retained placenta, large uterine fibroids  Postoperative diagnosis: Same  Anesthesia: epidural  Anesthesiologist: Dr. Glennon Mac  Procedure: manual extraction of placenta  Surgeon: Dr. Katharine Look Kimberlea Schlag  Estimated blood loss: 300 cc  Procedure:  After being informed of the planned procedure with possible complications including bleeding, infection and need for laparotomy/hysterectomy, informed consent is obtained and patient is taken to or #3. She is placed in the lithotomy position under epidural anesthesia. She is then prepped and draped in a sterile  fashion and her bladder is emptied with a Foley catheter.  We proceed with manual extraction of the placenta in 2 pieces without difficulty. Multiple large fibroids including sub-mucosal are noted with a dominant posterior fibroid measuring approximately 10 cm. The cavity  is very irregular which is why curettage was not performed.   Uterus is contracting adequately. The previously repaired 1st degree laceration had to be repaired again with 3-0 Vicryl.   Instrument and sponge count is complete x2. Cytotec 1000 mcg is inserted in rectum preventatively.   Estimated blood loss is 300 cc total.  The procedure is very well tolerated by the patient is taken to recovery room in a well and stable condition.  Specimen: Placenta sent to pathology

## 2016-10-05 NOTE — Anesthesia Postprocedure Evaluation (Signed)
Anesthesia Post Note  Patient: Victoria Yoder  Procedure(s) Performed: * No procedures listed *  Patient location during evaluation: Mother Baby Anesthesia Type: Epidural Level of consciousness: awake, awake and alert, oriented and patient cooperative Pain management: pain level controlled Vital Signs Assessment: post-procedure vital signs reviewed and stable Respiratory status: spontaneous breathing, respiratory function stable and nonlabored ventilation Cardiovascular status: stable Postop Assessment: patient able to bend at knees, no headache, no backache and no signs of nausea or vomiting Anesthetic complications: no Comments: Epidural remains in place d/t postpartum need for epidural in O.R. Earlier today.     Last Vitals:  Vitals:   10/05/16 1315 10/05/16 1440  BP: (!) 147/76 (!) 124/58  Pulse: 67 72  Resp: 17 18  Temp:  (!) 38.2 C    Last Pain:  Vitals:   10/05/16 1440  TempSrc: Oral  PainSc: 0-No pain   Pain Goal:                 Fumiye Lubben L

## 2016-10-05 NOTE — Progress Notes (Signed)
This note also relates to the following rows which could not be included: Pulse Rate - Cannot attach notes to unvalidated device data SpO2 - Cannot attach notes to unvalidated device data  Pt declines demerol , shivering gone

## 2016-10-05 NOTE — Addendum Note (Signed)
Addendum  created 10/05/16 1730 by Raenette Rover, CRNA   Sign clinical note

## 2016-10-06 ENCOUNTER — Ambulatory Visit (HOSPITAL_COMMUNITY)
Admit: 2016-10-06 | Discharge: 2016-10-06 | Disposition: A | Discharge status: Home / Self Care | Payer: BC Managed Care – PPO | Attending: Obstetrics and Gynecology | Admitting: Obstetrics and Gynecology

## 2016-10-06 ENCOUNTER — Ambulatory Visit (HOSPITAL_COMMUNITY): Admission: RE | Admit: 2016-10-06 | Payer: BC Managed Care – PPO | Source: Ambulatory Visit

## 2016-10-06 ENCOUNTER — Encounter (HOSPITAL_COMMUNITY): Payer: Self-pay

## 2016-10-06 ENCOUNTER — Ambulatory Visit (HOSPITAL_COMMUNITY): Payer: BC Managed Care – PPO

## 2016-10-06 LAB — CBC
HEMATOCRIT: 35.6 % — AB (ref 36.0–46.0)
HEMOGLOBIN: 12.6 g/dL (ref 12.0–15.0)
MCH: 31.3 pg (ref 26.0–34.0)
MCHC: 35.4 g/dL (ref 30.0–36.0)
MCV: 88.3 fL (ref 78.0–100.0)
Platelets: 214 10*3/uL (ref 150–400)
RBC: 4.03 MIL/uL (ref 3.87–5.11)
RDW: 14.5 % (ref 11.5–15.5)
WBC: 17 10*3/uL — AB (ref 4.0–10.5)

## 2016-10-06 NOTE — Progress Notes (Signed)
Subjective: Postpartum Day 1: Vaginal delivery, 1 laceration Patient up ad lib, reports no syncope or dizziness. Feeding:  Breast Contraceptive plan:  Undecided  Objective: Vital signs in last 24 hours: Temp:  [98.2 F (36.8 C)-100.7 F (38.2 C)] 98.3 F (36.8 C) (12/01 0516) Pulse Rate:  [67-113] 83 (12/01 0516) Resp:  [17-22] 18 (12/01 0516) BP: (98-161)/(58-108) 107/73 (12/01 0516) SpO2:  [100 %] 100 % (11/30 1017)  Physical Exam:  General: alert, cooperative and no distress Lochia: appropriate Uterine Fundus: firm Perineum: healing well DVT Evaluation: No evidence of DVT seen on physical exam. Negative Homan's sign.   CBC Latest Ref Rng & Units 10/06/2016 10/05/2016 10/05/2016  WBC 4.0 - 10.5 K/uL 17.0(H) 16.2(H) 14.8(H)  Hemoglobin 12.0 - 15.0 g/dL 12.6 13.4 14.1  Hematocrit 36.0 - 46.0 % 35.6(L) 37.3 38.9  Platelets 150 - 400 K/uL 214 227 265     Assessment/Plan: Status post vaginal delivery day 1. Stable and Afebrile Continue current care.  Finish Unasyn today. Plan for discharge tomorrow and Breastfeeding     Pleas Koch ProtheroCNM 10/06/2016, 6:23 AM

## 2016-10-06 NOTE — Lactation Note (Signed)
This note was copied from a baby's chart. Lactation Consultation Note  Patient Name: Victoria Yoder [id number] Date: 10/06/2016 Reason for consult: Follow-up assessment  This 371/[redacted] week GA , <5 pound infant is 10 hours old has had attempts at breast.  Following the LPT policy due to birth weight, <5 pounds. Breast feeding attempts - Latch score range - 5-7 , but noted as attempts Supplemented with 4-10 ml a  Feeding. 6 wets , 5 stools.  @ this [facility] consult baby had last fed at 1115 am per dad 5 ml. She woke up while  LC in the room, and LC checked diaper dry. Baby latched with wide open mouth for  Few strong sucks and released , 1 swallow noted.  LC had reviewed hand expressing with mom, without results. ( not unusual)  LC reassured mom to work on hand expressing when she is alone may be more comfortable  For her. LC instructed mom on the use of shells between feedings after she takes a shower and  Gets her IV out, also when not sleeping. LC felt the shells would elongate the nipple / areola complex To make it easier for the baby to sustain the latch.  LC fed the baby 10 ml and reviewed PACE feeding with mom and dad with good results. No spitting noted.  Attempted to latch after supplement and baby sucked a few times , released and fell asleep on moms chest with  2 baby blankets covering her.  Mom aware to work on the post pumping , STS, hand expressing, also LC recommended trying the portion of the supplement  As an appetizer, then latch.  LC gave MBU RN Bev Duffy Bruce report.   Maternal Data Has patient been taught Hand Expression?: Yes (reviewed hand expressing with mom both breast , no milk noted, LC reassured mom that is not uncommon at 1st )  Feeding Feeding Type: Breast Fed Nipple Type: Slow - flow Length of feed: 1 min  LATCH Score/Interventions Latch: Repeated attempts needed to sustain latch, nipple held in mouth throughout feeding, stimulation needed to elicit sucking  reflex. Intervention(s): Adjust position;Assist with latch;Breast massage;Breast compression  Audible Swallowing: None Intervention(s):  (unable to express colostrum with hand expression)  Type of Nipple: Everted at rest and after stimulation (more compressible , few strong sucks )  Comfort (Breast/Nipple): Soft / non-tender     Hold (Positioning): Assistance needed to correctly position infant at breast and maintain latch. Intervention(s): Breastfeeding basics reviewed;Support Pillows;Position options;Skin to skin  LATCH Score: 6  Lactation Tools Discussed/Used Tools: Shells (to elongate the nipple / areola for the latching )   Consult Status Consult Status: Follow-up Date: 10/07/16 Follow-up type: In-patient    [place] 10/06/2016, 12:37 PM

## 2016-10-06 NOTE — Plan of Care (Signed)
Problem: Fluid Volume: Goal: Ability to maintain a balanced intake and output will improve Outcome: Completed/Met Date Met: 10/06/16 Patient had foley catheter following delivery. She is voiding well since cath removal.

## 2016-10-07 ENCOUNTER — Ambulatory Visit: Payer: Self-pay

## 2016-10-07 MED ORDER — IBUPROFEN 600 MG PO TABS: 600.0000 mg | ORAL_TABLET | Freq: Four times a day (QID) | ORAL | 0 refills | Status: DC

## 2016-10-07 NOTE — Discharge Summary (Signed)
Obstetric Discharge Summary  Reason for Admission: onset of labor on 10/05/16 Prenatal Procedures: Significant prenatal events: 1st Trimester: 2nd Trimester: C/O cluster headaches. US reveals 6 fibroids. Offered genetic testing due to husband with Marfan syndrome features. MFM consult for uterine fibroids-recommend repeat US at 38weeks for vaginal vs cesarean delivery.  3rd Trimester: Failed 3hr GTT.  AC lag noted at 28wks. Lagging BPD, HC, and AC at 32wks. Fetal pylectasis noted on 37wk [place]  Last evaluation:  10/02/2016 in office by Dr. Gillermo Murdoch.  BP 102/64,. Wt:  172, TWG: 20lbs Intrapartum Procedures:Delivery Note  At 8:03 AM Yoder viable female, named Victoria Yoder,  was delivered via Vaginal, Spontaneous Delivery (Presentation: ROA ).  APGAR: 9, 9; weight 4 lb 9 oz (2070 g).     Anesthesia:  Epidural Episiotomy: None Lacerations: Vaginal;1st degree Suture Repair: 3.0 vicryl Est. Blood Loss (mL):  200  45 MINUTES AFTER DELIVERY,PLACENTA REMAINS IN SITU WITH NO DETACHMENT. NO ACTIVE BLEEDING. PATIENT CONSENTED FOR MANUAL EXTRACTION OF PLACENTA AND D&c. REVIEWED WITH PATIENT AND HUSBAND POSSIBLE RISKS INCLUDING: NEED FOR TRANSFUSION, NEED FOR LAPAROTOMY AND NEED FOR HYSTERECTOMY. QUESTIONS ANSWERED.  Mom to OR.  Baby to Nursery.  Victoria Yoder 10/05/2016, 8:59 AM Postpartum Procedures: curettage and antibiotics Complications-Operative and Postpartum: none and 1 degree perineal laceration  Hemoglobin  Date Value Ref Range Status  10/06/2016 12.6 12.0 - 15.0 g/dL Final   HCT  Date Value Ref Range Status  10/06/2016 35.6 (L) 36.0 - 46.0 % Final    Discharge Diagnoses: Term Pregnancy-delivered and IUGR  Physical exam:   General: normal Lochia: appropriate Uterine Fundus: 1/u/ firm non-tender  Extremities: No evidence of DVT seen on physical exam.    Hospital course: Onset of labor , IUGR, retained placenta, manual removal and curretage. Infant extended stay- Mom discharged  but rooming in.  Date: 10/07/2016 Activity: unrestricted Diet: routine Medications: Ibuprofen and Iron Condition: stable  Breastfeeding:   Yes.   Contraception:  undecided  Instructions: refer to practice specific booklet Discharge to: home [facility] Follow up in 6 day(s).   Specialty:  Obstetrics and Gynecology Contact information: [address] [place] [phone]          Newborn Data:   Baby female Name: Victoria Yoder CNM 10/07/2016, 10:58 AM

## 2016-10-07 NOTE — Lactation Note (Signed)
This note was copied from a baby's chart. Lactation Consultation Note  Patient Name: Victoria Yoder [id number] Date: 10/07/2016 Reason for consult: Follow-up assessment;Infant < 6lbs  Baby 13 hours old. Mom reports that she has continued to put baby to breast with each feeding, giving the baby an "appetizer" of formula first. Then mom is supplementing the baby with formula, followed by pumping. However, mom reports that she is not seeing any colostrum/EBM. Enc mom to continue to pump after each feeding followed by hand expression. Reviewed hand expression with mom, but mom did not want to practice at this time. Enc parents to switch to Neosure formula since mom is not producing any milk at this time. Brought formula to the room, along with additional bottle nipples and preemie diapers for the baby. Patient's bedside RN in the room and aware of the formula change. Enc mom to call for assistance as needed. Mom states that she has a personal DEP at home.    Maternal Data    Feeding Feeding Type: Formula Nipple Type: Slow - flow  LATCH Score/Interventions                      Lactation Tools Discussed/Used Tools: Pump Breast pump type: Double-Electric Breast Pump   Consult Status Consult Status: Follow-up Date: 10/08/16 Follow-up type: In-patient    Andres Labrum 10/07/2016, 9:30 PM

## 2016-10-07 NOTE — Discharge Instructions (Signed)

## 2016-10-08 ENCOUNTER — Ambulatory Visit: Payer: Self-pay

## 2016-10-08 NOTE — Lactation Note (Signed)
This note was copied from a baby's chart. Lactation Consultation Note  Patient Name: Victoria Yoder Today's Date: 10/08/2016  Follow up visit made prior to discharge. Mom is following lactation plan.  She is putting baby to breast for 15 minutes then pumping and supplementing 30 mls of expressed milk/formula per bottle.  Explained that baby is now 60 hours and supplement should be increase by about 10 mls each day up to 60 mls at one week.  Discussed with mom the importance of pumping 8-12 times/24 hours.  If plan becomes overwhelming recommended skipping a feeding at breast but make sure she pumps.  Outpatient lactation appointment scheduled for Thursday 10/12/16 9:00.  Encouraged to call with questions/concerns.  Maternal Data    Feeding    LATCH Score/Interventions                      Lactation Tools Discussed/Used     Consult Status      Ave Filter 10/08/2016, 10:08 AM

## 2016-10-09 LAB — TYPE AND SCREEN
BLOOD PRODUCT EXPIRATION DATE: 201712142359
Blood Product Expiration Date: 201712162359
Unit Type and Rh: 5100
Unit Type and Rh: 9500

## 2016-10-12 ENCOUNTER — Ambulatory Visit (HOSPITAL_COMMUNITY)
Admission: RE | Admit: 2016-10-12 | Discharge: 2016-10-12 | Disposition: A | Discharge status: Home / Self Care | Payer: BC Managed Care – PPO | Source: Ambulatory Visit | Attending: Obstetrics & Gynecology | Admitting: Obstetrics & Gynecology

## 2016-10-12 NOTE — Lactation Note (Signed)
Lactation Consult  Mother's reason for visit:  Feeding assist Visit Type:  Feeding assessment Appointment Notes:   Consult:  Initial Lactation Consultant:  Victoria Yoder  ________________________________________________________________________  [name] Date of Birth:  10/05/2018 Pediatrician:  Victoria Yoder Gender:  female Gestational Age: [redacted]w[redacted]d (At Birth) Birth Weight:  4 lb 9 oz (2070 g) Weight at Discharge:    Weight: (!) 4 lb 9 oz (2070 g)                         Date of Discharge:  10/08/2016      Filed Weights   10/06/16 0001 10/06/16 2315 10/07/16 2345  Weight: (!) 4 lb 7.4 oz (2025 g) (!) 4 lb 6.9 oz (2010 g) (!) 4 lb 9 oz (2070 g)  Last weight taken from [facility]:  4-9 on 10/10/16     Location:Pediatrician's office Weight today:  4-11.2   ________________________________________________________________________  Mother's Name: Victoria Yoder Type of delivery:  vaginal delivery Breastfeeding Experience:  First baby Maternal Medical Conditions:  Gestational diabetes mellitis diet controlled Maternal Medications:  Iron, PNV's  ________________________________________________________________________  Breastfeeding History (Post Discharge)  Frequency of breastfeeding:  3 times per day Duration of feeding:  15 minutes  Supplementation  Formula:  Volume 30-78ml Frequency:  Every 2-3 hours        Brand: Similac neosure  Breastmilk:  Volume 31ml Frequency:  3 times per day   Method:  Bottle,   Pumping  Type of pump:  Medela pump in style Frequency:  3-4 times/24 hours Volume:  20-42ml    Infant Intake and Output Assessment  Voids:  5 in 24 hrs.  Color:  Clear yellow Stools:  4 in 24 hrs.  Color:  Brown  ________________________________________________________________________  Maternal Breast Assessment  Breast:  Soft and Compressible Nipple:  Erect Pain level:  0 Pain interventions:   Bra  _______________________________________________________________________ Feeding Assessment/Evaluation  Mom and 7 day old infant here for feeding assessment.  Baby was born at 37.1 weeks and IUGR.  Mom's milk supply is low at this point.  Parents state baby is fussy and still acting hungry after 30-40 mls.  Baby has gained 2 ounces/2 days.  Assisted mom with positioning baby skin to skin in football hold.  Baby latched easily and well.  Observed baby suck actively for 15 minutes on each breast but she only transferred 2 mls.  Plan written for mom.  Goals are to 1)increase milk supply 2)weight gain of at least 1 ounce per day  Instructed to feed baby with feeding cues, baby should take 40-50 mls of expressed milk/formula or enough for her to be content and not showing feeding cues, put her to the breast 3 times per day and give supplement after, stressed importance of pumping 8 times/24 hours to establish and maintain a good milk supply.  Nap when baby sleeps and work on good nutrition/fluids.  Recommended mom call for follow up appointment once milk supply has increased.  Initial feeding assessment:  Infant's oral assessment:  WNL  Positioning:  Football Right breast/left breast  LATCH documentation:  Latch:  2 = Grasps breast easily, tongue down, lips flanged, rhythmical sucking.  Audible swallowing:  1 = A few with stimulation  Type of nipple:  2 = Everted at rest and after stimulation  Comfort (Breast/Nipple):  2 = Soft / non-tender  Hold (Positioning):  1 = Assistance needed to correctly position  infant at breast and maintain latch  LATCH score:  8  Attached assessment:  Deep  Lips flanged:  Yes.    Lips untucked:  No.  Suck assessment:  Nutritive     Pre-feed weight:  2132 g   Post-feed weight:  2134 g  Amount transferred:  2 ml Amount supplemented:  40 ml

## 2017-09-06 LAB — HM PAP SMEAR

## 2017-10-09 ENCOUNTER — Other Ambulatory Visit: Payer: Self-pay | Admitting: Obstetrics & Gynecology

## 2017-10-11 ENCOUNTER — Encounter: Payer: Self-pay | Admitting: Internal Medicine

## 2017-11-06 NOTE — L&D Delivery Note (Signed)
Delivery Note At 12:30 AM a viable female was delivered precipitously via Vaginal, Spontaneous (Presentation: direct OA).  APGAR: 9, 9; weight pending.   Placenta status: delivered spontaneously and completely.  Cord: three vessel with the following complications: none.   Anesthesia:  None Episiotomy: None Lacerations: 1st degree Suture Repair: 3.0 vicryl rapide Est. Blood Loss (mL): 200  Mom to postpartum.  Baby to Couplet care / Skin to Skin.  Marikay Alar 07/05/2018, 1:14 AM

## 2017-12-14 LAB — OB RESULTS CONSOLE HEPATITIS B SURFACE ANTIGEN: Hepatitis B Surface Ag: NEGATIVE

## 2017-12-14 LAB — OB RESULTS CONSOLE RPR: RPR: NONREACTIVE

## 2017-12-14 LAB — OB RESULTS CONSOLE RUBELLA ANTIBODY, IGM: Rubella: IMMUNE

## 2017-12-14 LAB — OB RESULTS CONSOLE HIV ANTIBODY (ROUTINE TESTING): HIV: NONREACTIVE

## 2017-12-17 LAB — OB RESULTS CONSOLE GC/CHLAMYDIA
Chlamydia: NEGATIVE
Gonorrhea: NEGATIVE

## 2018-02-13 ENCOUNTER — Other Ambulatory Visit (HOSPITAL_COMMUNITY): Payer: Self-pay | Admitting: Obstetrics & Gynecology

## 2018-02-13 DIAGNOSIS — Z3689 Encounter for other specified antenatal screening: Secondary | ICD-10-CM

## 2018-03-01 ENCOUNTER — Ambulatory Visit (HOSPITAL_COMMUNITY): Payer: BC Managed Care – PPO

## 2018-03-01 ENCOUNTER — Ambulatory Visit (HOSPITAL_COMMUNITY)
Admission: RE | Admit: 2018-03-01 | Discharge: 2018-03-01 | Disposition: A | Discharge status: Home / Self Care | Payer: BC Managed Care – PPO | Source: Ambulatory Visit | Attending: Obstetrics & Gynecology | Admitting: Obstetrics & Gynecology

## 2018-03-01 ENCOUNTER — Other Ambulatory Visit (HOSPITAL_COMMUNITY): Payer: Self-pay | Admitting: Obstetrics & Gynecology

## 2018-03-01 DIAGNOSIS — Z3A19 19 weeks gestation of pregnancy: Secondary | ICD-10-CM | POA: Insufficient documentation

## 2018-03-01 DIAGNOSIS — O99212 Obesity complicating pregnancy, second trimester: Secondary | ICD-10-CM | POA: Insufficient documentation

## 2018-03-01 DIAGNOSIS — Z3689 Encounter for other specified antenatal screening: Secondary | ICD-10-CM

## 2018-03-01 DIAGNOSIS — Z363 Encounter for antenatal screening for malformations: Secondary | ICD-10-CM | POA: Insufficient documentation

## 2018-06-10 ENCOUNTER — Ambulatory Visit: Payer: BC Managed Care – PPO | Admitting: Registered"

## 2018-06-12 ENCOUNTER — Encounter: Payer: BC Managed Care – PPO | Attending: Obstetrics & Gynecology | Admitting: Registered"

## 2018-06-12 ENCOUNTER — Ambulatory Visit: Payer: BC Managed Care – PPO

## 2018-06-12 DIAGNOSIS — Z713 Dietary counseling and surveillance: Secondary | ICD-10-CM | POA: Insufficient documentation

## 2018-06-12 DIAGNOSIS — O99810 Abnormal glucose complicating pregnancy: Secondary | ICD-10-CM | POA: Diagnosis not present

## 2018-06-12 DIAGNOSIS — Z3A00 Weeks of gestation of pregnancy not specified: Secondary | ICD-10-CM | POA: Insufficient documentation

## 2018-06-13 ENCOUNTER — Encounter: Payer: Self-pay | Admitting: Registered"

## 2018-06-13 DIAGNOSIS — O99810 Abnormal glucose complicating pregnancy: Secondary | ICD-10-CM | POA: Insufficient documentation

## 2018-06-13 NOTE — Progress Notes (Signed)
Patient was seen on 06/12/2018 for Gestational Diabetes self-management class at [facility]. The following learning objectives were met by the patient during this course:   States the definition of Gestational Diabetes  States why dietary management is important in controlling blood glucose  Describes the effects each nutrient has on blood glucose levels  Demonstrates ability to create a balanced meal plan  Demonstrates carbohydrate counting   States when to check blood glucose levels  Demonstrates proper blood glucose monitoring techniques  States the effect of stress and exercise on blood glucose levels  States the importance of limiting caffeine and abstaining from alcohol and smoking  Blood glucose monitor given: Contour Next Lot # [id number] Exp: 06/06/2019 Blood glucose reading: 65  Patient instructed to monitor glucose levels: FBS: 60 - <95; 1 hour: <140; 2 hour: <120  Patient received handouts:  Nutrition Diabetes and Pregnancy, including carb counting list  Patient will be seen for follow-up as needed.

## 2018-07-04 ENCOUNTER — Encounter (HOSPITAL_COMMUNITY): Payer: Self-pay

## 2018-07-04 ENCOUNTER — Inpatient Hospital Stay (HOSPITAL_COMMUNITY)
Admission: AD | Admit: 2018-07-04 | Discharge: 2018-07-04 | Disposition: A | Discharge status: Home / Self Care | Payer: BC Managed Care – PPO | Source: Ambulatory Visit | Attending: Obstetrics & Gynecology | Admitting: Obstetrics & Gynecology

## 2018-07-04 DIAGNOSIS — O479 False labor, unspecified: Secondary | ICD-10-CM

## 2018-07-04 HISTORY — DX: Type 2 diabetes mellitus without complications: E11.9

## 2018-07-04 NOTE — Discharge Instructions (Signed)
Braxton Hicks Contractions °Contractions of the uterus can occur throughout pregnancy, but they are not always a sign that you are in labor. You may have practice contractions called Braxton Hicks contractions. These false labor contractions are sometimes confused with true labor. °What are Braxton Hicks contractions? °Braxton Hicks contractions are tightening movements that occur in the muscles of the uterus before labor. Unlike true labor contractions, these contractions do not result in opening (dilation) and thinning of the cervix. Toward the end of pregnancy (32-34 weeks), Braxton Hicks contractions can happen more often and may become stronger. These contractions are sometimes difficult to tell apart from true labor because they can be very uncomfortable. You should not feel embarrassed if you go to the hospital with false labor. °Sometimes, the only way to tell if you are in true labor is for your health care provider to look for changes in the cervix. The health care provider will do a physical exam and may monitor your contractions. If you are not in true labor, the exam should show that your cervix is not dilating and your water has not broken. °If there are other health problems associated with your pregnancy, it is completely safe for you to be sent home with false labor. You may continue to have Braxton Hicks contractions until you go into true labor. °How to tell the difference between true labor and false labor °True labor °· Contractions last 30-70 seconds. °· Contractions become very regular. °· Discomfort is usually felt in the top of the uterus, and it spreads to the lower abdomen and low back. °· Contractions do not go away with walking. °· Contractions usually become more intense and increase in frequency. °· The cervix dilates and gets thinner. °False labor °· Contractions are usually shorter and not as strong as true labor contractions. °· Contractions are usually irregular. °· Contractions  are often felt in the front of the lower abdomen and in the groin. °· Contractions may go away when you walk around or change positions while lying down. °· Contractions get weaker and are shorter-lasting as time goes on. °· The cervix usually does not dilate or become thin. °Follow these instructions at home: °· Take over-the-counter and prescription medicines only as told by your health care provider. °· Keep up with your usual exercises and follow other instructions from your health care provider. °· Eat and drink lightly if you think you are going into labor. °· If Braxton Hicks contractions are making you uncomfortable: °? Change your position from lying down or resting to walking, or change from walking to resting. °? Sit and rest in a tub of warm water. °? Drink enough fluid to keep your urine pale yellow. Dehydration may cause these contractions. °? Do slow and deep breathing several times an hour. °· Keep all follow-up prenatal visits as told by your health care provider. This is important. °Contact a health care provider if: °· You have a fever. °· You have continuous pain in your abdomen. °Get help right away if: °· Your contractions become stronger, more regular, and closer together. °· You have fluid leaking or gushing from your vagina. °· You pass blood-tinged mucus (bloody show). °· You have bleeding from your vagina. °· You have low back pain that you never had before. °· You feel your baby’s head pushing down and causing pelvic pressure. °· Your baby is not moving inside you as much as it used to. °Summary °· Contractions that occur before labor are called Braxton   Hicks contractions, false labor, or practice contractions. °· Braxton Hicks contractions are usually shorter, weaker, farther apart, and less regular than true labor contractions. True labor contractions usually become progressively stronger and regular and they become more frequent. °· Manage discomfort from Braxton Hicks contractions by  changing position, resting in a warm bath, drinking plenty of water, or practicing deep breathing. °This information is not intended to replace advice given to you by your health care provider. Make sure you discuss any questions you have with your health care provider. °Document Released: 03/08/2017 Document Revised: 03/08/2017 Document Reviewed: 03/08/2017 °Elsevier Interactive Patient Education © 2018 [employer] ° °

## 2018-07-04 NOTE — MAU Note (Signed)
I have communicated with Ranee Gosselin, CNM and reviewed vital signs:  Vitals:   07/04/18 2213 07/04/18 2225  BP: 133/85 125/64  Pulse: (!) 102 74  Resp:    Temp:    SpO2:      Vaginal exam:  Dilation: Closed Effacement (%): Thick Cervical Position: Posterior Presentation: Vertex Exam by:: Frances Maywood RN ,   Also reviewed contraction pattern and that non-stress test is reactive.  It has been documented that patient is contracting every 2- 8 minutes with no  cervical change since yesterday not indicating active labor.  Patient denies any other complaints.  Based on this report provider has given order for discharge.  A discharge order and diagnosis entered by a provider.   Labor discharge instructions reviewed with patient.

## 2018-07-04 NOTE — MAU Note (Signed)
SAYS UC STRONG SINCE 530PM- VE IN OFFICE YESTERDAY - CLOSED.   DENIES HSV AND MRSA.   GBS- POSITIVE

## 2018-07-05 ENCOUNTER — Inpatient Hospital Stay (HOSPITAL_COMMUNITY)
Admission: AD | Admit: 2018-07-05 | Discharge: 2018-07-07 | DRG: 807 | Disposition: A | Discharge status: Home / Self Care | Payer: BC Managed Care – PPO | Attending: Obstetrics & Gynecology | Admitting: Obstetrics & Gynecology

## 2018-07-05 ENCOUNTER — Encounter (HOSPITAL_COMMUNITY): Payer: Self-pay

## 2018-07-05 DIAGNOSIS — O99824 Streptococcus B carrier state complicating childbirth: Secondary | ICD-10-CM | POA: Diagnosis present

## 2018-07-05 DIAGNOSIS — Z3A37 37 weeks gestation of pregnancy: Secondary | ICD-10-CM | POA: Diagnosis not present

## 2018-07-05 DIAGNOSIS — O24420 Gestational diabetes mellitus in childbirth, diet controlled: Principal | ICD-10-CM | POA: Diagnosis present

## 2018-07-05 DIAGNOSIS — Z3483 Encounter for supervision of other normal pregnancy, third trimester: Secondary | ICD-10-CM | POA: Diagnosis present

## 2018-07-05 DIAGNOSIS — Z880 Allergy status to penicillin: Secondary | ICD-10-CM

## 2018-07-05 LAB — CBC
HCT: 38.3 % (ref 36.0–46.0)
Hemoglobin: 13.6 g/dL (ref 12.0–15.0)
MCH: 31.6 pg (ref 26.0–34.0)
MCHC: 35.5 g/dL (ref 30.0–36.0)
MCV: 89.1 fL (ref 78.0–100.0)
PLATELETS: 232 10*3/uL (ref 150–400)
RBC: 4.3 MIL/uL (ref 3.87–5.11)
RDW: 14.3 % (ref 11.5–15.5)
WBC: 13.4 10*3/uL — ABNORMAL HIGH (ref 4.0–10.5)

## 2018-07-05 MED ORDER — DIPHENHYDRAMINE HCL 25 MG PO CAPS: 25.0000 mg | ORAL_CAPSULE | Freq: Four times a day (QID) | ORAL | Status: DC | PRN

## 2018-07-05 MED ORDER — ERYTHROMYCIN 5 MG/GM OP OINT
TOPICAL_OINTMENT | OPHTHALMIC | Status: AC
  Filled 2018-07-05: qty 1

## 2018-07-05 MED ORDER — WITCH HAZEL-GLYCERIN EX PADS: 1.0000 "application " | MEDICATED_PAD | CUTANEOUS | Status: DC | PRN

## 2018-07-05 MED ORDER — SIMETHICONE 80 MG PO CHEW: 80.0000 mg | CHEWABLE_TABLET | ORAL | Status: DC | PRN

## 2018-07-05 MED ORDER — SENNOSIDES-DOCUSATE SODIUM 8.6-50 MG PO TABS
2.0000 | ORAL_TABLET | ORAL | Status: DC
  Administered 2018-07-05 – 2018-07-06 (×2): 2 via ORAL
  Filled 2018-07-05 (×2): qty 2

## 2018-07-05 MED ORDER — TETANUS-DIPHTH-ACELL PERTUSSIS 5-2.5-18.5 LF-MCG/0.5 IM SUSP: 0.5000 mL | Freq: Once | INTRAMUSCULAR | Status: DC

## 2018-07-05 MED ORDER — OXYTOCIN 10 UNIT/ML IJ SOLN
10.0000 [IU] | Freq: Once | INTRAMUSCULAR | Status: AC
  Administered 2018-07-05: 10 [IU] via INTRAMUSCULAR
  Filled 2018-07-05: qty 1

## 2018-07-05 MED ORDER — PRENATAL MULTIVITAMIN CH
1.0000 | ORAL_TABLET | Freq: Every day | ORAL | Status: DC
  Administered 2018-07-05 – 2018-07-07 (×3): 1 via ORAL
  Filled 2018-07-05 (×4): qty 1

## 2018-07-05 MED ORDER — OXYTOCIN 40 UNITS IN LACTATED RINGERS INFUSION - SIMPLE MED: 2.5000 [IU]/h | INTRAVENOUS | Status: DC | PRN

## 2018-07-05 MED ORDER — BENZOCAINE-MENTHOL 20-0.5 % EX AERO
1.0000 "application " | INHALATION_SPRAY | CUTANEOUS | Status: DC | PRN
  Filled 2018-07-05 (×2): qty 56

## 2018-07-05 MED ORDER — COCONUT OIL OIL
1.0000 "application " | TOPICAL_OIL | Status: DC | PRN
  Administered 2018-07-07: 1 via TOPICAL
  Filled 2018-07-05: qty 120

## 2018-07-05 MED ORDER — ZOLPIDEM TARTRATE 5 MG PO TABS: 5.0000 mg | ORAL_TABLET | Freq: Every evening | ORAL | Status: DC | PRN

## 2018-07-05 MED ORDER — IBUPROFEN 600 MG PO TABS
600.0000 mg | ORAL_TABLET | Freq: Four times a day (QID) | ORAL | Status: DC
  Administered 2018-07-05 – 2018-07-07 (×10): 600 mg via ORAL
  Filled 2018-07-05 (×10): qty 1

## 2018-07-05 MED ORDER — DIBUCAINE 1 % RE OINT
1.0000 "application " | TOPICAL_OINTMENT | RECTAL | Status: DC | PRN
  Filled 2018-07-05: qty 28

## 2018-07-05 MED ORDER — ONDANSETRON HCL 4 MG/2ML IJ SOLN: 4.0000 mg | INTRAMUSCULAR | Status: DC | PRN

## 2018-07-05 MED ORDER — LIDOCAINE HCL (PF) 1 % IJ SOLN
INTRAMUSCULAR | Status: AC
  Filled 2018-07-05: qty 30

## 2018-07-05 MED ORDER — OXYCODONE-ACETAMINOPHEN 5-325 MG PO TABS
1.0000 | ORAL_TABLET | ORAL | Status: DC | PRN
  Administered 2018-07-05 (×2): 1 via ORAL
  Filled 2018-07-05 (×2): qty 1

## 2018-07-05 MED ORDER — OXYCODONE-ACETAMINOPHEN 5-325 MG PO TABS: 2.0000 | ORAL_TABLET | ORAL | Status: DC | PRN

## 2018-07-05 MED ORDER — ACETAMINOPHEN 325 MG PO TABS
650.0000 mg | ORAL_TABLET | ORAL | Status: DC | PRN
  Administered 2018-07-05: 650 mg via ORAL
  Filled 2018-07-05: qty 2

## 2018-07-05 MED ORDER — ONDANSETRON HCL 4 MG PO TABS: 4.0000 mg | ORAL_TABLET | ORAL | Status: DC | PRN

## 2018-07-05 NOTE — Lactation Note (Signed)
This note was copied from a baby's chart. Lactation Consultation Note Baby 3 hrs old. Has low blood sugar and low temp. Mom resting. Cramping really bad, asked not to hand express at this time.  Mom has no questions at this time. States remembers cluster feeding. Mom encouraged to feed baby 8-12 times/24 hours and with feeding cues. Educated about newborn behavior.  Encouraged to call for assistance. Encouraged STS  [place] brochure given w/resources, support groups and [place] services. Patient Name: [name] [id number] Date: 07/05/2018 Reason for consult: Initial assessment;Early term 37-38.6wks   Maternal Data Has patient been taught Hand Expression?: Yes Does the patient have breastfeeding experience prior to this delivery?: Yes  Feeding Feeding Type: Breast Fed Length of feed: 25 min  LATCH Score Latch: Repeated attempts needed to sustain latch, nipple held in mouth throughout feeding, stimulation needed to elicit sucking reflex.  Audible Swallowing: None  Type of Nipple: Everted at rest and after stimulation  Comfort (Breast/Nipple): Soft / non-tender  Hold (Positioning): Assistance needed to correctly position infant at breast and maintain latch.  LATCH Score: 6  Interventions Interventions: Breast feeding basics reviewed  Lactation Tools Discussed/Used     Consult Status Consult Status: Follow-up Date: 07/06/18 Follow-up type: In-patient    Verda Mehta, [place]ta [place] 07/05/2018, 4:18 AM

## 2018-07-05 NOTE — Progress Notes (Signed)
Report given to [facility] in [place].

## 2018-07-06 ENCOUNTER — Encounter (HOSPITAL_COMMUNITY): Payer: Self-pay | Admitting: *Deleted

## 2018-07-06 ENCOUNTER — Other Ambulatory Visit: Payer: Self-pay

## 2018-07-06 NOTE — Lactation Note (Signed)
This note was copied from a baby's chart. Lactation Consultation Note  Patient Name: Victoria Yoder [id number] Date: 07/06/2018 Reason for consult: Follow-up assessment;Early term 37-38.6wks P2, ETI, Mom BF using the  football hold, infant BF for 5-6  minutes then stopped. Mom has not been hand expressing nor using DEBP pumping to give back infant EBM. [place] asked  Mom to  hand expressed and very little colostrum was present from breast. Mom aware of formula risk. LC discussed ETI behaviors and Mom wants to   BF then supplement w/ formula due to hours/ after birth (20ml of Similac w/ iron) given 19 kcal by curve tip syringe by LC. Mom will BF according hunger cues, 8 to 12 times within 24 hours including nights, mom will not exceed 3 hours w/out feeding infant. Mom will BF 10-20 minutes then give infant EBM or formula but will not feed past 30 minutes due infant being ETI. Mom plans to pump every 3 hours w/  DEBP.  Mom shown how to use DEBP & how to disassemble, clean, & reassemble parts. Mom will call LC if she has any questions or concerns.  Maternal Data Formula Feeding for Exclusion: No  Feeding   LATCH Score Latch: Repeated attempts needed to sustain latch, nipple held in mouth throughout feeding, stimulation needed to elicit sucking reflex.  Audible Swallowing: Spontaneous and intermittent  Type of Nipple: Everted at rest and after stimulation  Comfort (Breast/Nipple): Soft / non-tender  Hold (Positioning): No assistance needed to correctly position infant at breast.  LATCH Score: 9  Interventions    Lactation Tools Discussed/Used Tools: Feeding cup Pump Review: Setup, frequency, and cleaning;Milk Storage Initiated by:: Victoria Yoder, IBCLC Date initiated:: 07/06/18   Consult Status Consult Status: Follow-up Date: 07/06/18    Victoria Yoder 07/06/2018, 5:08 AM

## 2018-07-06 NOTE — Progress Notes (Signed)
Post Partum Day 1 Subjective: no complaints, up ad lib, voiding and tolerating PO  Objective: Vitals:   07/05/18 0801 07/05/18 1250 07/05/18 1729 07/05/18 2230  BP: 126/73 118/70 124/73 108/74  Pulse: 75 72 83 (!) 108  Resp: 18 16 16 18   Temp: 97.9 F (36.6 C) 98.7 F (37.1 C) 98 F (36.7 C) 98.3 F (36.8 C)  TempSrc: Oral Oral Oral Oral  SpO2:        Physical Exam:  General: alert and cooperative Lochia: appropriate Uterine Fundus: firm Incision: n/a DVT Evaluation: No evidence of DVT seen on physical exam. Negative Homan's sign. No cords or calf tenderness. No significant calf/ankle edema.  Recent Labs    07/05/18 0225  HGB 13.6  HCT 38.3    Assessment/Plan: Plan for discharge tomorrow and Breastfeeding/bottle feeding both   LOS: 1 day   Marikay Alar 07/06/2018, 6:20 AM

## 2018-07-07 LAB — RPR: RPR: NONREACTIVE

## 2018-07-07 MED ORDER — IBUPROFEN 600 MG PO TABS: 600.0000 mg | ORAL_TABLET | Freq: Four times a day (QID) | ORAL | 0 refills | Status: AC

## 2018-07-07 NOTE — Discharge Summary (Signed)
OB Discharge Summary     Patient Name: Victoria Yoder DOB: 08-15-1986 MRN: [id number]  Date of admission: 07/05/2018 Delivering MD: Ranee Gosselin K   Date of discharge: 07/07/2018  Admitting diagnosis: 38 WEEKS CTX Intrauterine pregnancy: [redacted]w[redacted]d     Secondary diagnosis:  Active Problems:   * No active hospital problems. *  Additional problems:      Discharge diagnosis: Term Pregnancy Delivered and GDM A2                                                                                                Post partum procedures:  Augmentation:   Complications: Precipitous delivery   Hospital course:  Onset of Labor With Vaginal Delivery     32 y.o. yo G2P2002 at [redacted]w[redacted]d was admitted in Active Labor on 07/05/2018. Patient had an uncomplicated labor course as follows:  Membrane Rupture Time/Date: 11:30 PM ,07/04/2018   Intrapartum Procedures: Episiotomy: None [1]                                         Lacerations:  1st degree [2]  Patient had a delivery of a Viable infant. 07/05/2018  Information for the patient's newborn:  Taia, Bramlett [[id number]]  Delivery Method: Vaginal, Spontaneous(Filed from Delivery Summary)    Pateint had an uncomplicated postpartum course.  She is ambulating, tolerating a regular diet, passing flatus, and urinating well. Patient is discharged home in stable condition on 07/07/18.   Physical exam  Vitals:   07/05/18 2230 07/06/18 0635 07/06/18 1442 07/07/18 0537  BP: 108/74 111/79 113/78 119/73  Pulse: (!) 108 75 77 74  Resp: 18 18 18 18   Temp: 98.3 F (36.8 C) 97.9 F (36.6 C) 98.5 F (36.9 C) 100 F (37.8 C)  TempSrc: Oral Oral Oral Oral  SpO2:    100%  Weight: 79.9 kg     Height: 5\' 1"  (1.549 m)      General: alert, cooperative and no distress Lochia: appropriate Uterine Fundus: firm Incision:  DVT Evaluation: No evidence of DVT seen on physical exam. Labs: Lab Results  Component Value Date   WBC 13.4 (H) 07/05/2018   HGB 13.6  07/05/2018   HCT 38.3 07/05/2018   MCV 89.1 07/05/2018   PLT 232 07/05/2018   CMP Latest Ref Rng & Units 10/05/2016  Glucose 65 - 99 mg/dL 82  BUN 6 - 20 mg/dL 7  Creatinine 0.44 - 1.00 mg/dL 0.55  Sodium 135 - 145 mmol/L 134(L)  Potassium 3.5 - 5.1 mmol/L 3.7  Chloride 101 - 111 mmol/L 101  CO2 22 - 32 mmol/L 23  Calcium 8.9 - 10.3 mg/dL 9.0  Total Protein 6.5 - 8.1 g/dL 6.0(L)  Total Bilirubin 0.3 - 1.2 mg/dL 0.8  Alkaline Phos 38 - 126 U/L 187(H)  AST 15 - 41 U/L 33  ALT 14 - 54 U/L 19    Discharge instruction: per After Visit Summary and "Baby and Me Booklet".  After visit meds:  Allergies as of  07/07/2018      Reactions   Penicillins Diarrhea, Nausea And Vomiting   Has patient had a PCN reaction causing immediate rash, facial/tongue/throat swelling, SOB or lightheadedness with hypotension: No Has patient had a PCN reaction causing severe rash involving mucus membranes or skin necrosis: No Has patient had a PCN reaction that required hospitalization: No Has patient had a PCN reaction occurring within the last 10 years: Yes If all of the above answers are "NO", then may proceed with Cephalosporin use.      Medication List    STOP taking these medications   acetaminophen 500 MG tablet Commonly known as:  TYLENOL   BENADRYL ALLERGY 25 mg capsule Generic drug:  diphenhydrAMINE   glyBURIDE 5 MG tablet Commonly known as:  DIABETA     TAKE these medications   ferrous sulfate 325 (65 FE) MG tablet Take 325 mg by mouth daily with breakfast.   fluticasone 50 MCG/ACT nasal spray Commonly known as:  FLONASE Place 1 spray into both nostrils daily as needed for allergies or rhinitis.   hydrocortisone cream 1 % Apply 1 application topically 2 (two) times daily as needed for itching.   ibuprofen 600 MG tablet Commonly known as:  ADVIL,MOTRIN Take 1 tablet (600 mg total) by mouth every 6 (six) hours.   PRE-NATAL FORMULA Tabs Taking daily What changed:    how much to  take  how to take this  when to take this       Diet: routine diet  Activity: Advance as tolerated. Pelvic rest for 6 weeks.   Outpatient follow up:6 weeks Follow up Appt:No future appointments. Follow up Visit:No follow-ups on file.  Postpartum contraception: Undecided  Newborn Data: Live born female  Birth Weight: 6 lb 2.1 oz (2780 g) APGAR: 64, 9  Newborn Delivery   Birth date/time:  07/05/2018 00:30:00 Delivery type:  Vaginal, Spontaneous     Baby Feeding: Breast Disposition:home with mother   07/07/2018 Larey Days, CNM

## 2018-07-07 NOTE — Lactation Note (Signed)
This note was copied from a baby's chart. Lactation Consultation Note  Patient Name: Victoria Yoder [id number] Date: 07/07/2018 Reason for consult: Follow-up assessment;Infant < 6lbs;Early term 37-38.6wks   P2, Baby [redacted]w[redacted]d < 6 lbs. Mother breastfeeding, pumping and supplementing after feedings. Mother pumped 25 ml earlier today and 11 ml with last pumping session. Taught mother how to do hands on pumping. Baby latched in football hold w/ intermittent swallows. Recommend mother post pump 4-6 times per day for 10-20 min with DEBP on initiation setting. Give baby back volume pumped at the next feeding with the difference w/ formula. Mom encouraged to feed baby 8-12 times/24 hours and with feeding cues at least q 3 hours.  Provided mother w/ coconut oil for tender nipples.  Reviewed engorgement care and monitoring voids/stools.       Maternal Data    Feeding Feeding Type: Breast Fed  LATCH Score Latch: Grasps breast easily, tongue down, lips flanged, rhythmical sucking.  Audible Swallowing: A few with stimulation  Type of Nipple: Everted at rest and after stimulation  Comfort (Breast/Nipple): Filling, red/small blisters or bruises, mild/mod discomfort  Hold (Positioning): No assistance needed to correctly position infant at breast.  LATCH Score: 8  Interventions Interventions: DEBP;Coconut oil  Lactation Tools Discussed/Used Tools: 47F feeding tube / Syringe;Pump Breast pump type: Double-Electric Breast Pump   Consult Status Consult Status: Complete Date: 07/08/18    [name] [facility] 07/07/2018, 10:48 AM

## 2018-07-07 NOTE — Discharge Instructions (Signed)

## 2018-07-15 NOTE — H&P (Signed)
Victoria Yoder is a 32 y.o. female presenting for active labor. OB History as of 07/07/2018    Gravida  2   Para  2   Term  2   Preterm      AB      Living  2     SAB      TAB      Ectopic      Multiple  0   Live Births  2          Past Medical History:  Diagnosis Date  . Asthma   . Chicken pox   . Diabetes mellitus without complication (HCC)    GDM   . Environmental allergies   . Heartburn   . Migraine    Past Surgical History:  Procedure Laterality Date  . DILATION AND CURETTAGE OF UTERUS N/A 10/05/2016   Procedure: DILATATION AND CURETTAGE;  Surgeon: Delsa Bern, MD;  Location: [facility];  Service: Gynecology;  Laterality: N/A;  . Unremarkable.     Family History: family history includes Arthritis in her father; Diabetes in her father, maternal grandmother, and paternal grandmother; Heart disease in her paternal grandmother; Hypertension in her paternal grandmother; Stroke in her maternal grandmother. Social History:  reports that she has never smoked. She has never used smokeless tobacco. She reports that she drinks alcohol. She reports that she does not use drugs.     Maternal Diabetes: Yes:  Diabetes Type:  Diet controlled Genetic Screening: Declined Maternal Ultrasounds/Referrals: Normal Fetal Ultrasounds or other Referrals:  None, Fetal echo Maternal Substance Abuse:  No Significant Maternal Medications:  None Significant Maternal Lab Results:  Lab values include: Group B Strep positive Other Comments:  None  Review of Systems  Gastrointestinal: Positive for abdominal pain.  All other systems reviewed and are negative.  Maternal Medical History:  Reason for admission: Contractions.   Contractions: Onset was 6-12 hours ago.   Frequency: regular.   Perceived severity is strong.    Fetal activity: Perceived fetal activity is normal.   Last perceived fetal movement was within the past hour.    Prenatal Complications - Diabetes: type  1. Diabetes is managed by diet.        Blood pressure 119/73, pulse 74, temperature 100 F (37.8 C), temperature source Oral, resp. rate 18, height 5\' 1"  (1.549 m), weight 79.9 kg, SpO2 100 %, unknown if currently breastfeeding.   Maternal Exam:  Abdomen: Fetal presentation: vertex  Introitus: Normal vulva. Normal vagina.  Pelvis: adequate for delivery.   Cervix: Cervix evaluated by digital exam.     Fetal Exam Fetal Monitor Review: Mode: ultrasound.    Fetal State Assessment: Category I - tracings are normal.     Physical Exam  Nursing note and vitals reviewed. Constitutional: She is oriented to person, place, and time. She appears well-developed and well-nourished.  HENT:  Head: Normocephalic.  Eyes: Pupils are equal, round, and reactive to light.  Genitourinary: Vagina normal and uterus normal.  Musculoskeletal: Normal range of motion.  Neurological: She is alert and oriented to person, place, and time.  Skin: Skin is warm and dry.  Psychiatric: She has a normal mood and affect. Her behavior is normal. Judgment and thought content normal.    Prenatal labs: ABO, Rh:  O+ Antibody:  Negative Rubella: Immune (02/08 0000) RPR: Non Reactive (08/31 2025)  HBsAg: Negative (02/08 0000)  HIV: Non-reactive (02/08 0000)  GBS:  Positive   Assessment/Plan: 32 y.o. G2P1 delivered precipitously in MAU  Viable female Admit to postpartum  GBS positive with no time for prophylaxis, patient to stay with baby for at least [place] 07/15/2018, 3:54 PM
